# Patient Record
Sex: Female | Born: 1990 | Race: White | Hispanic: No | Marital: Single | Smoking: Never smoker
Health system: Southern US, Community
[De-identification: ages and names within clinical notes are randomized; demographics above are authoritative.]

## PROBLEM LIST (undated history)

## (undated) DIAGNOSIS — F419 Anxiety disorder, unspecified: Secondary | ICD-10-CM

## (undated) DIAGNOSIS — F32A Depression, unspecified: Secondary | ICD-10-CM

## (undated) DIAGNOSIS — T7840XA Allergy, unspecified, initial encounter: Secondary | ICD-10-CM

## (undated) DIAGNOSIS — D649 Anemia, unspecified: Secondary | ICD-10-CM

## (undated) DIAGNOSIS — E559 Vitamin D deficiency, unspecified: Secondary | ICD-10-CM

## (undated) HISTORY — DX: Depression, unspecified: F32.A

## (undated) HISTORY — DX: Anemia, unspecified: D64.9

## (undated) HISTORY — DX: Anxiety disorder, unspecified: F41.9

## (undated) HISTORY — PX: ADENOIDECTOMY: SUR15

## (undated) HISTORY — DX: Allergy, unspecified, initial encounter: T78.40XA

## (undated) HISTORY — PX: NO PAST SURGERIES: SHX2092

## (undated) HISTORY — DX: Vitamin D deficiency, unspecified: E55.9

---

## 2016-08-26 ENCOUNTER — Ambulatory Visit (INDEPENDENT_AMBULATORY_CARE_PROVIDER_SITE_OTHER): Payer: BLUE CROSS/BLUE SHIELD | Admitting: Urgent Care

## 2016-08-26 VITALS — BP 112/62 | HR 68 | Temp 98.1°F | Resp 16 | Ht 65.0 in | Wt 142.0 lb

## 2016-08-26 DIAGNOSIS — Z23 Encounter for immunization: Secondary | ICD-10-CM | POA: Diagnosis not present

## 2016-08-26 DIAGNOSIS — Z111 Encounter for screening for respiratory tuberculosis: Secondary | ICD-10-CM

## 2016-08-26 DIAGNOSIS — Z0000 Encounter for general adult medical examination without abnormal findings: Secondary | ICD-10-CM

## 2016-08-26 DIAGNOSIS — Z7189 Other specified counseling: Secondary | ICD-10-CM | POA: Diagnosis not present

## 2016-08-26 DIAGNOSIS — Z7185 Encounter for immunization safety counseling: Secondary | ICD-10-CM

## 2016-08-26 LAB — CBC
HCT: 38.1 % (ref 35.0–45.0)
HEMOGLOBIN: 12.7 g/dL (ref 11.7–15.5)
MCH: 30.1 pg (ref 27.0–33.0)
MCHC: 33.3 g/dL (ref 32.0–36.0)
MCV: 90.3 fL (ref 80.0–100.0)
MPV: 9.1 fL (ref 7.5–12.5)
Platelets: 265 10*3/uL (ref 140–400)
RBC: 4.22 MIL/uL (ref 3.80–5.10)
RDW: 12.2 % (ref 11.0–15.0)
WBC: 6.1 10*3/uL (ref 3.8–10.8)

## 2016-08-26 LAB — POCT URINALYSIS DIP (MANUAL ENTRY)
Bilirubin, UA: NEGATIVE
Glucose, UA: NEGATIVE
Ketones, POC UA: NEGATIVE
LEUKOCYTES UA: NEGATIVE
Nitrite, UA: NEGATIVE
PROTEIN UA: NEGATIVE
SPEC GRAV UA: 1.025
UROBILINOGEN UA: 0.2
pH, UA: 6

## 2016-08-26 LAB — HEPATITIS B SURFACE ANTIBODY, QUANTITATIVE: Hepatitis B-Post: 1000 m[IU]/mL

## 2016-08-26 NOTE — Progress Notes (Signed)

## 2016-08-26 NOTE — Progress Notes (Addendum)
Patient ID: NYIA TSAO, female   DOB: 14-Jul-1991, 25 y.o.   MRN: [id number]  By signing my name below I, Tereasa Coop, attest that this documentation has been prepared under the direction and in the presence of Jaynee Eagles, [place]. Electonically Signed. Tereasa Coop, Scribe 08/26/2016 at 9:24 AM   MRN: [id number]  Subjective:   Ms. Jill Stafford is a 25 y.o. female presenting for annual physical exam.  Medical care team includes: PCP: No primary care provider on file. Vision: wears glasses in class and at night while driving. Is followed by her eye doctor every year. Dental: Has cleanings every 6 months. OB/GYN: Normal Pap smear with her OB/Gyn June 2017. 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 Lives with friends. Has good support system. Drinks 1-2 alcoholic drinks per week. Denies history of smoking.    [name]  does not have a problem list on file.  [name]  Current Outpatient Prescriptions:    ethynodiol-ethinyl estradiol (KELNOR,ZOVIA) 1-35 MG-MCG tablet, Take 1 tablet by mouth daily., Disp: , Rfl:   She is allergic to fluconazole. Causes rash.   [name]  has a past medical history of Allergy; Anemia; and Anxiety. Denies past surgical history.  Denies any hospitalizations.  Paternal grandfather had lung cancer. Maternal Grandfather had prostate cancer.  Immunizations:  Needs Tdap and flu immunizations today. Had chicken pox as a child, did not have the vaccine. Needs chicken pox, hep B, and MMR titers. Needs TB testing. Denies history of positive TB test.    ROS    Objective:   Vitals: BP 112/62    Pulse 68    Temp 98.1 F (36.7 C) (Oral)    Resp 16    Ht '5\' 5"'$  (1.651 m)    Wt 142 lb (64.4 kg)    LMP 07/19/2016    SpO2 99%    BMI 23.63 kg/m   Physical Exam  Constitutional: She is oriented to person, place, and time. She appears well-developed and well-nourished.  HENT:  TM's intact bilaterally, no effusions or erythema. Nasal  turbinates pink and moist, nasal passages patent. No sinus tenderness. Oropharynx clear, mucous membranes moist, dentition in good repair.  Eyes: Conjunctivae and EOM are normal. Pupils are equal, round, and reactive to light. Right eye exhibits no discharge. Left eye exhibits no discharge. No scleral icterus.  Neck: Normal range of motion. Neck supple. No thyromegaly present.  Cardiovascular: Normal rate, regular rhythm and intact distal pulses.  Exam reveals no gallop and no friction rub.   No murmur heard. Pulmonary/Chest: No respiratory distress. She has no wheezes. She has no rales.  Abdominal: Soft. Bowel sounds are normal. She exhibits no distension and no mass. There is no tenderness.  Musculoskeletal: Normal range of motion. She exhibits no edema or tenderness.  Lymphadenopathy:    She has no cervical adenopathy.  Neurological: She is alert and oriented to person, place, and time. She has normal reflexes.  Skin: Skin is warm and dry. No rash noted. No erythema. No pallor.  Psychiatric: She has a normal mood and affect.   Results for orders placed or performed in visit on 08/26/16 (from the past 24 hour(s))  POCT urinalysis dipstick     Status: Abnormal   Collection Time: 08/26/16  9:15 AM  Result Value Ref Range   Color, UA yellow yellow   Clarity, UA clear clear   Glucose, UA negative negative   Bilirubin, UA negative negative   Ketones,  POC UA negative negative   Spec Grav, UA 1.025    Blood, UA small (A) negative   pH, UA 6.0    Protein Ur, POC negative negative   Urobilinogen, UA 0.2    Nitrite, UA Negative Negative   Leukocytes, UA Negative Negative    Assessment and Plan :   1. Annual physical exam - Medically healthy pleasant young woman. Discussed healthy lifestyle, diet, exercise, preventative care, vaccinations, and addressed patient's concerns.   2. Immunization counseling - Measles/Mumps/Rubella Immunity - Varicella zoster antibody, IgG - Hepatitis B  surface antibody  3. Need for prophylactic vaccination and inoculation against influenza - Flu Vaccine QUAD 36+ mos IM  4. Need for diphtheria-tetanus-pertussis (Tdap) vaccine - Tdap vaccine greater than or equal to 7yo IM  5. Screening for tuberculosis - TB Skin Test, 2 step PPD process reviewed with patient.  Jaynee Eagles, PA-C [facility] [phone] 08/26/2016  8:56 AM

## 2016-08-26 NOTE — Patient Instructions (Signed)

## 2016-08-28 ENCOUNTER — Ambulatory Visit (INDEPENDENT_AMBULATORY_CARE_PROVIDER_SITE_OTHER): Payer: BLUE CROSS/BLUE SHIELD | Admitting: Physician Assistant

## 2016-08-28 VITALS — BP 112/74 | HR 80 | Temp 98.2°F | Resp 17 | Ht 65.0 in | Wt 142.0 lb

## 2016-08-28 DIAGNOSIS — Z111 Encounter for screening for respiratory tuberculosis: Secondary | ICD-10-CM

## 2016-08-28 LAB — VARICELLA ZOSTER ANTIBODY, IGG: VARICELLA IGG: 1977 {index} — AB (ref <135.00)

## 2016-08-28 LAB — TB SKIN TEST
INDURATION: 0 mm
TB Skin Test: NEGATIVE

## 2016-08-28 LAB — MEASLES/MUMPS/RUBELLA IMMUNITY
Mumps IgG: 26.5 AU/mL — ABNORMAL HIGH (ref <9.00)
RUBELLA: 2.57 {index} — AB (ref <0.90)
Rubeola IgG: 82 AU/mL — ABNORMAL HIGH (ref <25.00)

## 2016-08-28 NOTE — Patient Instructions (Signed)
     IF you received an x-ray today, you will receive an invoice from [facility]. Please contact  Radiology at [phone] with questions or concerns regarding your invoice.   IF you received labwork today, you will receive an invoice from [facility]. Please contact [facility] at [phone] with questions or concerns regarding your invoice.   Our billing staff will not be able to assist you with questions regarding bills from these companies.  You will be contacted with the lab results as soon as they are available. The fastest way to get your results is to activate your My Chart account. Instructions are located on the last page of this paperwork. If you have not heard from us regarding the results in 2 weeks, please contact this office.      

## 2016-08-30 NOTE — Progress Notes (Signed)
Skin test reading

## 2016-09-14 ENCOUNTER — Ambulatory Visit (INDEPENDENT_AMBULATORY_CARE_PROVIDER_SITE_OTHER): Payer: BLUE CROSS/BLUE SHIELD | Admitting: Urgent Care

## 2016-09-14 VITALS — BP 110/72 | HR 83 | Temp 97.3°F | Resp 17 | Ht 65.0 in | Wt 134.0 lb

## 2016-09-14 DIAGNOSIS — Z111 Encounter for screening for respiratory tuberculosis: Secondary | ICD-10-CM | POA: Diagnosis not present

## 2016-09-14 NOTE — Patient Instructions (Addendum)
If the PPD tests do not meet their criteria, please ask if it would suffice for [place] to perform a quantiferon blood test to screen for tuberculosis.    IF you received an x-ray today, you will receive an invoice from [facility]. Please contact [facility] at [phone] with questions or concerns regarding your invoice.   IF you received labwork today, you will receive an invoice from [facility]. Please contact [facility] at [phone] with questions or concerns regarding your invoice.   Our billing staff will not be able to assist you with questions regarding bills from these companies.  You will be contacted with the lab results as soon as they are available. The fastest way to get your results is to activate your My Chart account. Instructions are located on the last page of this paperwork. If you have not heard from [place] regarding the results in 2 weeks, please contact this office.

## 2016-09-14 NOTE — Progress Notes (Signed)
    MRN: [id number] DOB: [birth year]  Subjective:   Jill Stafford is a 25 y.o. 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 She has completed all required testing. She has immunity to MMR, varicella, Hep B. Her form has been completed. She needs 2nd step PPD today.  Brenae has a current medication list which includes the following prescription(s): ethynodiol-ethinyl estradiol. Also is allergic to fluconazole.  Branden  has a past medical history of Allergy; Anemia; and Anxiety. Also  has no past surgical history on file.   Objective:   Vitals: BP 110/72 (BP Location: Right Arm, Patient Position: Sitting, Cuff Size: Normal)   Pulse 83   Temp 97.3 F (36.3 C) (Oral)   Resp 17   Ht _0  (1.651 m)   Wt 134 lb (60.8 kg)   LMP 08/15/2016 (Approximate)   SpO2 100%   BMI 22.30 kg/m   Physical Exam  Constitutional: She is oriented to person, place, and time. She appears well-developed and well-nourished.  Cardiovascular: Normal rate.   Pulmonary/Chest: Effort normal.  Neurological: She is alert and oriented to person, place, and time.   Assessment and Plan :   1. Screening for tuberculosis - PPD placed today. Patient to rtc on 09/16/2016. If her school does not accept the 2nd PPD for the time frame they require, we will pursue a Quantiferon test. I reviewed her titers in clinic for MMR, varicella, Hep B.    Jaynee Eagles, PA-C [facility] [phone] 09/14/2016 2:47 PM

## 2017-01-09 ENCOUNTER — Ambulatory Visit (INDEPENDENT_AMBULATORY_CARE_PROVIDER_SITE_OTHER): Payer: BLUE CROSS/BLUE SHIELD | Admitting: Physician Assistant

## 2017-01-09 VITALS — BP 112/60 | HR 84 | Temp 97.8°F | Resp 18 | Ht 65.0 in | Wt 132.0 lb

## 2017-01-09 DIAGNOSIS — Z111 Encounter for screening for respiratory tuberculosis: Secondary | ICD-10-CM

## 2017-01-09 NOTE — Patient Instructions (Signed)
     IF you received an x-ray today, you will receive an invoice from [facility]. Please contact [facility] at [phone] with questions or concerns regarding your invoice.   IF you received labwork today, you will receive an invoice from [facility]. Please contact [facility] at [phone] with questions or concerns regarding your invoice.   Our billing staff will not be able to assist you with questions regarding bills from these companies.  You will be contacted with the lab results as soon as they are available. The fastest way to get your results is to activate your My Chart account. Instructions are located on the last page of this paperwork. If you have not heard from us regarding the results in 2 weeks, please contact this office.     

## 2017-01-09 NOTE — Progress Notes (Signed)
  01/09/2017 3:26 PM   DOB: 13-Mar-1991 / MRN: [id number]  SUBJECTIVE:  Jill Stafford is a 26 y.o. female presenting for TB rule out.  Denies cough, night sweats, chronic fever, SOB, sudden unexplained weight loss.  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   Immunization History  Administered Date(s) Administered  . Influenza,inj,Quad PF,36+ Mos 08/26/2016  . PPD Test 08/26/2016, 09/14/2016  . Tdap 08/26/2016    She is allergic to fluconazole.   She  has a past medical history of Allergy; Anemia; and Anxiety.    She  reports that she has never smoked. She has never used smokeless tobacco. She reports that she does not drink alcohol or use drugs. She  has no sexual activity history on file. The patient  has no past surgical history on file.  Her family history is not on file.  ROS  Per HPI  The problem list and medications were reviewed and updated by myself where necessary and exist elsewhere in the encounter.   OBJECTIVE:  BP 112/60   Pulse 84   Temp 97.8 F (36.6 C) (Oral)   Resp 18   Ht 5\' 5"  (1.651 m)   Wt 132 lb (59.9 kg)   SpO2 99%   BMI 21.97 kg/m   Physical Exam  Constitutional: She appears well-nourished.  Cardiovascular: Normal rate.   Pulmonary/Chest: Effort normal.  Vitals reviewed.   No results found for this or any previous visit (from the past 72 hour(s)).  No results found.  ASSESSMENT AND PLAN:  Jill Stafford was seen today for labs only.  Diagnoses and all orders for this visit:  Screening-pulmonary TB -     Quantiferon tb gold assay    The patient is advised to call or return to clinic if she does not see an improvement in symptoms, or to seek the care of the closest emergency department if she worsens with the above plan.   Philis Fendt, MHS, PA-C [facility] 01/09/2017 3:26 PM

## 2017-01-12 LAB — QUANTIFERON TB GOLD ASSAY (BLOOD)

## 2017-01-12 LAB — QUANTIFERON IN TUBE
QFT TB AG MINUS NIL VALUE: 0.05 IU/mL
QUANTIFERON MITOGEN VALUE: 6.59 [IU]/mL
QUANTIFERON NIL VALUE: 0.02 [IU]/mL
QUANTIFERON TB AG VALUE: 0.07 [IU]/mL
QUANTIFERON TB GOLD: NEGATIVE

## 2018-05-22 ENCOUNTER — Ambulatory Visit: Payer: Self-pay | Admitting: Family Medicine

## 2018-05-22 VITALS — BP 95/70 | HR 66 | Temp 98.4°F | Resp 16 | Wt 152.2 lb

## 2018-05-22 DIAGNOSIS — N390 Urinary tract infection, site not specified: Secondary | ICD-10-CM

## 2018-05-22 LAB — POCT URINALYSIS DIPSTICK
BILIRUBIN UA: NEGATIVE
Glucose, UA: NEGATIVE
Ketones, UA: NEGATIVE
Nitrite, UA: POSITIVE
PH UA: 7.5 (ref 5.0–8.0)
Protein, UA: POSITIVE — AB
RBC UA: NEGATIVE
SPEC GRAV UA: 1.01 (ref 1.010–1.025)
UROBILINOGEN UA: 1 U/dL

## 2018-05-22 MED ORDER — PHENAZOPYRIDINE HCL 200 MG PO TABS: 200.0000 mg | ORAL_TABLET | Freq: Three times a day (TID) | ORAL | 0 refills | Status: DC | PRN

## 2018-05-22 MED ORDER — NITROFURANTOIN MONOHYD MACRO 100 MG PO CAPS: 100.0000 mg | ORAL_CAPSULE | Freq: Two times a day (BID) | ORAL | 0 refills | Status: AC

## 2018-05-22 NOTE — Patient Instructions (Signed)

## 2018-05-22 NOTE — Progress Notes (Signed)
Jill Stafford is a 27 y.o. female who presents today with concerns of painful urination for the last 2 weeks without treatment. She denies recent similar infection with treatment in the last 30 days.  Review of Systems  Constitutional: Negative for chills, fever and malaise/fatigue.  HENT: Negative for congestion, ear discharge, ear pain, sinus pain and sore throat.   Eyes: Negative.   Respiratory: Negative for cough, sputum production and shortness of breath.   Cardiovascular: Negative.  Negative for chest pain.  Gastrointestinal: Negative for abdominal pain, diarrhea, nausea and vomiting.  Genitourinary: Positive for dysuria, frequency and urgency. Negative for hematuria.  Musculoskeletal: Negative for myalgias.  Skin: Negative.   Neurological: Negative for headaches.  Endo/Heme/Allergies: Negative.   Psychiatric/Behavioral: Negative.     O: Vitals:   05/22/18 1227  BP: 95/70  Pulse: 66  Resp: 16  Temp: 98.4 F (36.9 C)  SpO2: 96%     Physical Exam  Constitutional: She is oriented to person, place, and time. Vital signs are normal. She appears well-developed and well-nourished. She is active.  Non-toxic appearance. She does not have a sickly appearance.  HENT:  Head: Normocephalic.  Right Ear: Hearing, tympanic membrane, external ear and ear canal normal.  Left Ear: Hearing, tympanic membrane, external ear and ear canal normal.  Nose: Nose normal.  Mouth/Throat: Uvula is midline and oropharynx is clear and moist.  Neck: Normal range of motion. Neck supple.  Cardiovascular: Normal rate, regular rhythm, normal heart sounds and normal pulses.  Pulmonary/Chest: Effort normal and breath sounds normal.  Abdominal: Soft. Bowel sounds are normal. There is tenderness in the suprapubic area. There is no CVA tenderness.  Musculoskeletal: Normal range of motion.  Lymphadenopathy:       Head (right side): No submental and no submandibular adenopathy present.       Head (left side):  No submental and no submandibular adenopathy present.    She has no cervical adenopathy.  Neurological: She is alert and oriented to person, place, and time.  Psychiatric: She has a normal mood and affect.  Vitals reviewed.    A: 1. Urinary tract infection without hematuria, site unspecified      P: Exam findings, diagnosis etiology and medication use and indications reviewed with patient. Follow- Up and discharge instructions provided. No emergent/urgent issues found on exam.  Patient verbalized understanding of information provided and agrees with plan of care (POC), all questions answered.  1. Urinary tract infection without hematuria, site unspecified - POCT urinalysis dipstick - nitrofurantoin, macrocrystal-monohydrate, (MACROBID) 100 MG capsule; Take 1 capsule (100 mg total) by mouth 2 (two) times daily for 10 days. - phenazopyridine (PYRIDIUM) 200 MG tablet; Take 1 tablet (200 mg total) by mouth 3 (three) times daily as needed for pain. With food- may discolor urine  Results for orders placed or performed in visit on 05/22/18 (from the past 24 hour(s))  POCT urinalysis dipstick     Status: Abnormal   Collection Time: 05/22/18 12:36 PM  Result Value Ref Range   Color, UA YELLOW    Clarity, UA CLOUDY    Glucose, UA Negative Negative   Bilirubin, UA NEGATIVE    Ketones, UA NEGATIVE    Spec Grav, UA 1.010 1.010 - 1.025   Blood, UA NEGATIVE    pH, UA 7.5 5.0 - 8.0   Protein, UA Positive (A) Negative   Urobilinogen, UA 1.0 0.2 or 1.0 E.U./dL   Nitrite, UA POSITIVE    Leukocytes, UA Large (3+) (A) Negative  Appearance     Odor

## 2018-05-24 ENCOUNTER — Telehealth: Payer: Self-pay | Admitting: Emergency Medicine

## 2018-05-24 NOTE — Telephone Encounter (Signed)
Called patient to follow up with them since their visit, no response let vm

## 2018-06-10 ENCOUNTER — Ambulatory Visit: Payer: Self-pay | Admitting: Family Medicine

## 2018-06-10 VITALS — BP 110/62 | HR 94 | Temp 98.3°F | Resp 16 | Wt 155.0 lb

## 2018-06-10 DIAGNOSIS — N390 Urinary tract infection, site not specified: Secondary | ICD-10-CM

## 2018-06-10 DIAGNOSIS — R300 Dysuria: Secondary | ICD-10-CM

## 2018-06-10 LAB — POCT URINALYSIS DIPSTICK
Bilirubin, UA: NEGATIVE
Blood, UA: NEGATIVE
Glucose, UA: NEGATIVE
KETONES UA: 10
NITRITE UA: NEGATIVE
PH UA: 5.5 (ref 5.0–8.0)
Protein, UA: POSITIVE — AB
Spec Grav, UA: 1.01 (ref 1.010–1.025)
UROBILINOGEN UA: 0.2 U/dL

## 2018-06-10 MED ORDER — CIPROFLOXACIN HCL 250 MG PO TABS: 250.0000 mg | ORAL_TABLET | Freq: Two times a day (BID) | ORAL | 0 refills | Status: DC

## 2018-06-10 NOTE — Patient Instructions (Signed)
Pyelonephritis, Adult Pyelonephritis is a kidney infection. The kidneys are organs that help clean your blood by moving waste out of your blood and into your pee (urine). This infection can happen quickly, or it can last for a long time. In most cases, it clears up with treatment and does not cause other problems. Follow these instructions at home: Medicines  Take over-the-counter and prescription medicines only as told by your doctor.  Take your antibiotic medicine as told by your doctor. Do not stop taking the medicine even if you start to feel better. General instructions  Drink enough fluid to keep your pee clear or pale yellow.  Avoid caffeine, tea, and carbonated drinks.  Pee (urinate) often. Avoid holding in pee for long periods of time.  Pee before and after sex.  After pooping (having a bowel movement), women should wipe from front to back. Use each tissue only once.  Keep all follow-up visits as told by your doctor. This is important. Contact a doctor if:  You do not feel better after 2 days.  Your symptoms get worse.  You have a fever. Get help right away if:  You cannot take your medicine or drink fluids as told.  You have chills and shaking.  You throw up (vomit).  You have very bad pain in your side (flank) or back.  You feel very weak or you pass out (faint). This information is not intended to replace advice given to you by your health care provider. Make sure you discuss any questions you have with your health care provider. Document Released: 11/23/2004 Document Revised: 03/23/2016 Document Reviewed: 02/08/2015 Elsevier Interactive Patient Education  2018 Elsevier Inc.  

## 2018-06-10 NOTE — Progress Notes (Signed)
Jill Stafford is a 27 y.o. female who presents today with concerns of urinary tract infection. She was recently treated for this condition in the last 30 days. She reported improvement then return of symptoms.  Review of Systems  Constitutional: Negative for chills, fever and malaise/fatigue.  HENT: Negative for congestion, ear discharge, ear pain, sinus pain and sore throat.   Eyes: Negative.   Respiratory: Negative for cough, sputum production and shortness of breath.   Cardiovascular: Negative.  Negative for chest pain.  Gastrointestinal: Negative for abdominal pain, diarrhea, nausea and vomiting.  Genitourinary: Positive for dysuria, frequency and urgency. Negative for hematuria.  Musculoskeletal: Positive for back pain. Negative for myalgias.  Skin: Negative.   Neurological: Negative for headaches.  Endo/Heme/Allergies: Negative.   Psychiatric/Behavioral: Negative.     O: Vitals:   06/10/18 1431  BP: 110/62  Pulse: 94  Resp: 16  Temp: 98.3 F (36.8 C)  SpO2: 97%     Physical Exam  Constitutional: She is oriented to person, place, and time. Vital signs are normal. She appears well-developed and well-nourished. She is active.  Non-toxic appearance. She does not have a sickly appearance.  HENT:  Head: Normocephalic.  Right Ear: Hearing, tympanic membrane, external ear and ear canal normal.  Left Ear: Hearing, tympanic membrane, external ear and ear canal normal.  Nose: Nose normal.  Mouth/Throat: Uvula is midline and oropharynx is clear and moist.  Neck: Normal range of motion. Neck supple.  Cardiovascular: Normal rate, regular rhythm, normal heart sounds and normal pulses.  Pulmonary/Chest: Effort normal and breath sounds normal.  Abdominal: Soft. Bowel sounds are normal. There is no tenderness. There is CVA tenderness. There is no rigidity, no rebound, no guarding, no tenderness at McBurney's point and negative Murphy's sign.  Lower abdominal pelvic discomfort with  bilateral flank pain when tapped  Musculoskeletal: Normal range of motion.  Lymphadenopathy:       Head (right side): No submental and no submandibular adenopathy present.       Head (left side): No submental and no submandibular adenopathy present.    She has no cervical adenopathy.  Neurological: She is alert and oriented to person, place, and time.  Psychiatric: She has a normal mood and affect.  Vitals reviewed.  A: 1. Urinary tract infection without hematuria, site unspecified   2. Dysuria     P: Discussed exam findings, diagnosis etiology and medication use and indications reviewed with patient. Follow- Up and discharge instructions provided. No emergent/urgent issues found on exam.  Patient verbalized understanding of information provided and agrees with plan of care (POC), all questions answered.  1. Urinary tract infection without hematuria, site unspecified Advised to use medication and f/u in 36-48 hours via phone or in person with report on improvement progress-advised to seek care with PCP for comprehensive evaluation if symptoms persist.    Meds ordered this encounter  Medications  . ciprofloxacin (CIPRO) 250 MG tablet    Sig: Take 1 tablet (250 mg total) by mouth 2 (two) times daily.    Dispense:  6 tablet    Refill:  0    Order Specific Question:   Supervising Provider    Answer:   Benay Pillow E [[id number]]    2. Dysuria - POCT urinalysis dipstick - ciprofloxacin (CIPRO) 250 MG tablet; Take 1 tablet (250 mg total) by mouth 2 (two) times daily.  Results for orders placed or performed in visit on 06/10/18 (from the past 24 hour(s))  POCT urinalysis dipstick  Status: Abnormal   Collection Time: 06/10/18  2:45 PM  Result Value Ref Range   Color, UA YELLOW    Clarity, UA CLOUDY    Glucose, UA Negative Negative   Bilirubin, UA NEGATIVE    Ketones, UA 10    Spec Grav, UA 1.010 1.010 - 1.025   Blood, UA negative    pH, UA 5.5 5.0 - 8.0   Protein, UA Positive  (A) Negative   Urobilinogen, UA 0.2 0.2 or 1.0 E.U./dL   Nitrite, UA NEGATIVE    Leukocytes, UA Large (3+) (A) Negative   Appearance     Odor

## 2018-06-12 ENCOUNTER — Telehealth: Payer: Self-pay

## 2018-06-12 NOTE — Telephone Encounter (Signed)
I left a message asking the patient asking to call us back if she has any questions or concerns.

## 2020-07-07 ENCOUNTER — Other Ambulatory Visit: Payer: Self-pay

## 2020-07-07 ENCOUNTER — Ambulatory Visit: Payer: 59 | Admitting: Physician Assistant

## 2020-07-07 ENCOUNTER — Encounter: Payer: Self-pay | Admitting: Physician Assistant

## 2020-07-07 VITALS — BP 100/70 | HR 74 | Temp 97.8°F | Resp 16 | Ht 65.0 in | Wt 161.0 lb

## 2020-07-07 DIAGNOSIS — N946 Dysmenorrhea, unspecified: Secondary | ICD-10-CM

## 2020-07-07 LAB — POCT URINE PREGNANCY: Preg Test, Ur: NEGATIVE

## 2020-07-07 MED ORDER — ETHYNODIOL DIAC-ETH ESTRADIOL 1-35 MG-MCG PO TABS: 1.0000 | ORAL_TABLET | Freq: Every day | ORAL | 11 refills | Status: DC

## 2020-07-07 NOTE — Patient Instructions (Signed)
Please restart the OCP as directed. Make sure to continue consistent use of condoms for STI prevention.  You will be contacted to schedule a pelvic ultrasound so we can assess for cysts/fibroids, etc.   Try to apply some barrier cream to the area of concern to protect it from friction and let things calm down. This seems to be the start of a benign skin tag. Let me know if still causing issue.

## 2020-07-07 NOTE — Progress Notes (Signed)
Patient presents to clinic today to establish care.  Patient with history of irregular periods and dysmenorrhea. Was previously on OCPs but has not had in a couple of years. Is noting recurrence of significant dysmenorrhea over the past several months culminating in significant cramping and low back pain. Noted significant increase in the amount of bleeding. There is some family history of fibroids but she has never been diagnosed with this. Will take Ibuprofen but was not as helpful as usual. Pain starts 3-4 days before menstrual pain and usually dissipates 3 days into cycle. Periods typically last 5-6 days. Does have history of iron deficiency.  LMP ended on 06/15/2020. Is sexually active with one female partner with consistent use of condoms.   Health Maintenance: Immunizations -- records requested. PAP -- Overdue per patient.   Past Medical History:  Diagnosis Date   Allergy    Anemia    Anxiety     No past surgical history on file.  No current outpatient medications on file prior to visit.   No current facility-administered medications on file prior to visit.    Allergies  Allergen Reactions   Fluconazole Rash    No family history on file.  Social History   Socioeconomic History   Marital status: Single    Spouse name: Not on file   Number of children: Not on file   Years of education: Not on file   Highest education level: Not on file  Occupational History   Not on file  Tobacco Use   Smoking status: Never Smoker   Smokeless tobacco: Never Used  Substance and Sexual Activity   Alcohol use: No   Drug use: No   Sexual activity: Not on file  Other Topics Concern   Not on file  Social History Narrative   Not on file   Social Determinants of Health   Financial Resource Strain:    Difficulty of Paying Living Expenses: Not on file  Food Insecurity:    Worried About [place] in the Last Year: Not on file   Ran Out of Food in the  Last Year: Not on file  Transportation Needs:    Lack of Transportation (Medical): Not on file   Lack of Transportation (Non-Medical): Not on file  Physical Activity:    Days of Exercise per Week: Not on file   Minutes of Exercise per Session: Not on file  Stress:    Feeling of Stress : Not on file  Social Connections:    Frequency of Communication with Friends and Family: Not on file   Frequency of Social Gatherings with Friends and Family: Not on file   Attends Religious Services: Not on file   Active Member of Clubs or Organizations: Not on file   Attends Archivist Meetings: Not on file   Marital Status: Not on file  Intimate Partner Violence:    Fear of Current or Ex-Partner: Not on file   Emotionally Abused: Not on file   Physically Abused: Not on file   Sexually Abused: Not on file   ROS Pertinent ROS are listed in the HPI.   Resp 16    Ht 5\' 5"  (1.651 m)    Wt 161 lb (73 kg)    BMI 26.79 kg/m   Physical Exam Vitals reviewed.  Constitutional:      Appearance: Normal appearance.  HENT:     Head: Normocephalic and atraumatic.  Eyes:     Conjunctiva/sclera: Conjunctivae normal.  Pupils: Pupils are equal, round, and reactive to light.  Cardiovascular:     Rate and Rhythm: Normal rate and regular rhythm.     Pulses: Normal pulses.     Heart sounds: Normal heart sounds.  Pulmonary:     Effort: Pulmonary effort is normal.     Breath sounds: Normal breath sounds.  Abdominal:     General: Bowel sounds are normal.     Palpations: Abdomen is soft.  Musculoskeletal:     Cervical back: Neck supple.  Neurological:     Mental Status: She is alert.  Psychiatric:        Mood and Affect: Mood normal.    Assessment/Plan: 1. Dysmenorrhea Urine preg negative. Will restart prior OCP to start to help with symptoms. Will also proceed with pelvic/transvaginal US to assess ovaries and uterus to ensure no cysts/fibroids contributing to her symptoms.   - POCT urine pregnancy - US Pelvic Complete With Transvaginal; Future  This visit occurred during the SARS-CoV-2 public health emergency.  Safety protocols were in place, including screening questions prior to the visit, additional usage of staff PPE, and extensive cleaning of exam room while observing appropriate contact time as indicated for disinfecting solutions.    Leeanne Rio, PA-C

## 2020-07-17 ENCOUNTER — Encounter: Payer: Self-pay | Admitting: Physician Assistant

## 2020-07-19 ENCOUNTER — Ambulatory Visit
Admission: RE | Admit: 2020-07-19 | Discharge: 2020-07-19 | Disposition: A | Discharge status: Home / Self Care | Payer: 59 | Source: Ambulatory Visit | Attending: Family Medicine | Admitting: Family Medicine

## 2020-07-19 ENCOUNTER — Other Ambulatory Visit: Payer: Self-pay

## 2020-07-19 VITALS — HR 88 | Temp 98.1°F | Resp 16

## 2020-07-19 DIAGNOSIS — N946 Dysmenorrhea, unspecified: Secondary | ICD-10-CM

## 2020-07-19 MED ORDER — MEFENAMIC ACID 250 MG PO CAPS: 250.0000 mg | ORAL_CAPSULE | Freq: Four times a day (QID) | ORAL | 0 refills | Status: DC | PRN

## 2020-07-19 NOTE — ED Triage Notes (Signed)
Patient states that when pain occurs it is severe and today patient felt that her hands became numb with increase in pelvic pan today. Denies any fever

## 2020-07-19 NOTE — ED Provider Notes (Signed)
[facility]    CSN: [id number] Arrival date & time: 07/19/20  1201      History   Chief Complaint Chief Complaint  Patient presents with  . Pelvic Pain    HPI Jill Stafford is a 29 y.o. female.   Complains of pelvic pain with menstrual period.  Had formally been on birth control pills since age 43 but those were discontinued and her pain has recurred.  HPI  Past Medical History:  Diagnosis Date  . Allergy   . Anemia   . Anxiety     There are no problems to display for this patient.   Past Surgical History:  Procedure Laterality Date  . NO PAST SURGERIES      OB History   No obstetric history on file.      Home Medications    Prior to Admission medications   Medication Sig Start Date End Date Taking? Authorizing Provider  ethynodiol-ethinyl estradiol (ZOVIA) 1-35 MG-MCG tablet Take 1 tablet by mouth daily. 07/07/20  Yes Brunetta Jeans, PA-C  Mefenamic Acid 250 MG CAPS Take 1 capsule (250 mg total) by mouth 4 (four) times daily as needed. 07/19/20   Wardell Honour, MD    Family History Family History  Problem Relation Age of Onset  . Rheum arthritis Father   . Cancer Maternal Grandfather        Lung  . Cancer Paternal Grandfather        Prostate    Social History Social History   Tobacco Use  . Smoking status: Never Smoker  . Smokeless tobacco: Never Used  Vaping Use  . Vaping Use: Never used  Substance Use Topics  . Alcohol use: No  . Drug use: No     Allergies   Fluconazole   Review of Systems Review of Systems  Genitourinary: Positive for menstrual problem and pelvic pain.  All other systems reviewed and are negative.    Physical Exam Triage Vital Signs ED Triage Vitals  Enc Vitals Group     BP --      Pulse Rate 07/19/20 1217 88     Resp 07/19/20 1217 16     Temp 07/19/20 1217 98.1 F (36.7 C)     Temp Source 07/19/20 1217 Oral     SpO2 07/19/20 1217 96 %     Weight --      Height --      Head  Circumference --      Peak Flow --      Pain Score 07/19/20 1222 6     Pain Loc --      Pain Edu? --      Excl. in [place]? --    No data found.  Updated Vital Signs Pulse 88   Temp 98.1 F (36.7 C) (Oral)   Resp 16   LMP 07/19/2020 (LMP Unknown)   SpO2 96%   Visual Acuity Right Eye Distance:   Left Eye Distance:   Bilateral Distance:    Right Eye Near:   Left Eye Near:    Bilateral Near:     Physical Exam Vitals and nursing note reviewed.  Constitutional:      Appearance: Normal appearance.  HENT:     Head: Normocephalic.  Abdominal:     General: Abdomen is flat. Bowel sounds are normal.     Palpations: Abdomen is soft.  Neurological:     Mental Status: She is alert and oriented to person, place, and time.  UC Treatments / Results  Labs (all labs ordered are listed, but only abnormal results are displayed) Labs Reviewed - No data to display  EKG   Radiology No results found.  Procedures Procedures (including critical care time)  Medications Ordered in UC Medications - No data to display  Initial Impression / Assessment and Plan / UC Course  I have reviewed the triage vital signs and the nursing notes.  Pertinent labs & imaging results that were available during my care of the patient were reviewed by me and considered in my medical decision making (see chart for details).     Dysmenorrhea Final Clinical Impressions(s) / UC Diagnoses   Final diagnoses:  None   Discharge Instructions   None    ED Prescriptions    Medication Sig Dispense Auth. Provider   Mefenamic Acid 250 MG CAPS Take 1 capsule (250 mg total) by mouth 4 (four) times daily as needed. 28 capsule Wardell Honour, MD     PDMP not reviewed this encounter.   Wardell Honour, MD 07/19/20 [phone]

## 2020-07-19 NOTE — ED Triage Notes (Signed)
Patient coming in with complaints of pelvic pain since May. Patient is currently on period, complains of increased back pain. Saw primary care doctor and has ultrasound scheduled on 07/30/20. Patient states she took ibuprofen 3- 200 mg tabs today and did have some relief.  One episode of emesis today.

## 2020-07-24 ENCOUNTER — Other Ambulatory Visit: Payer: Self-pay | Admitting: Family Medicine

## 2020-07-30 ENCOUNTER — Other Ambulatory Visit: Payer: Self-pay | Admitting: Physician Assistant

## 2020-07-30 ENCOUNTER — Ambulatory Visit
Admission: RE | Admit: 2020-07-30 | Discharge: 2020-07-30 | Disposition: A | Discharge status: Home / Self Care | Payer: 59 | Source: Ambulatory Visit | Attending: Physician Assistant | Admitting: Physician Assistant

## 2020-07-30 ENCOUNTER — Telehealth: Payer: Self-pay

## 2020-07-30 DIAGNOSIS — N946 Dysmenorrhea, unspecified: Secondary | ICD-10-CM

## 2020-07-30 DIAGNOSIS — N838 Other noninflammatory disorders of ovary, fallopian tube and broad ligament: Secondary | ICD-10-CM

## 2020-07-30 IMAGING — US US PELVIS COMPLETE WITH TRANSVAGINAL
1 series · 13 of 25 positions shown · non-contrast
Comparison: None

CLINICAL DATA: Patient with dysmenorrhea.

EXAM:
TRANSABDOMINAL AND TRANSVAGINAL ULTRASOUND OF PELVIS
TECHNIQUE: Both transabdominal and transvaginal ultrasound examinations of the
pelvis were performed. Transabdominal technique was performed for
global imaging of the pelvis including uterus, ovaries, adnexal
regions, and pelvic cul-de-sac. It was necessary to proceed with
endovaginal exam following the transabdominal exam to visualize the
adnexal structures.

[Series 1: us pelvis complete with transvaginal · 0.20mm/px · 13 of 95 slices shown]
[im 1/95]
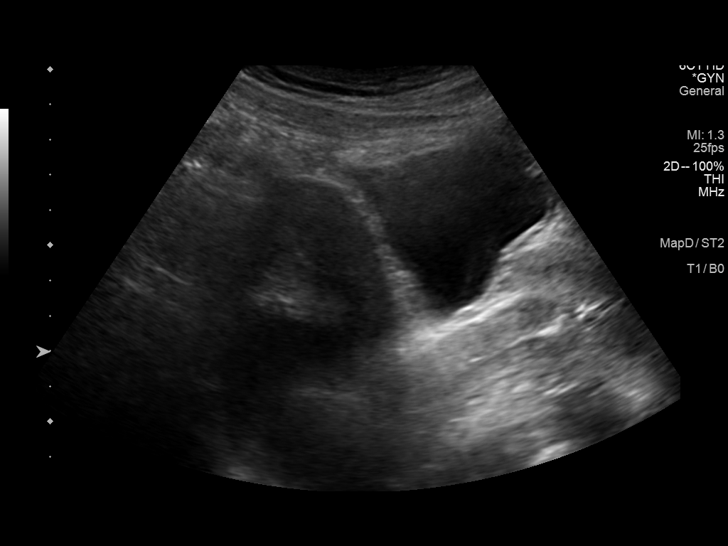
[im 8/95]
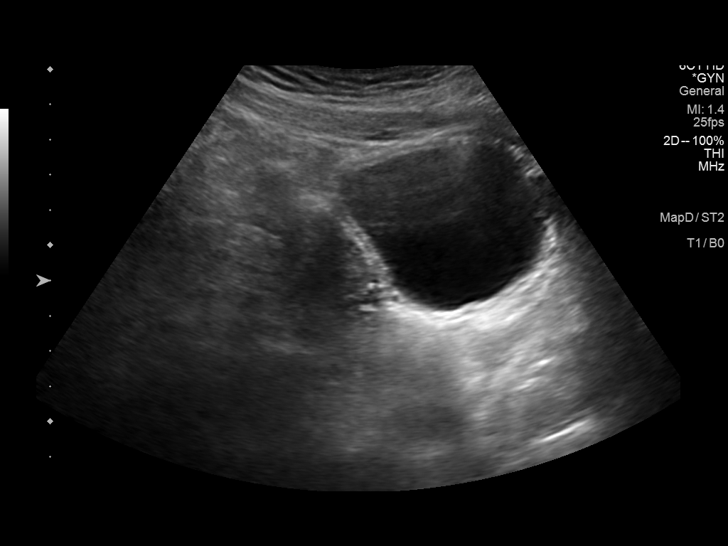
[im 16/95]
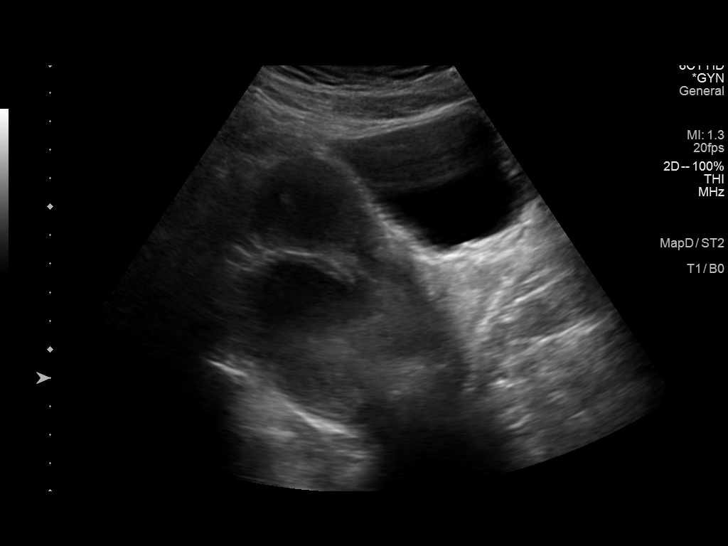
[im 24/95]
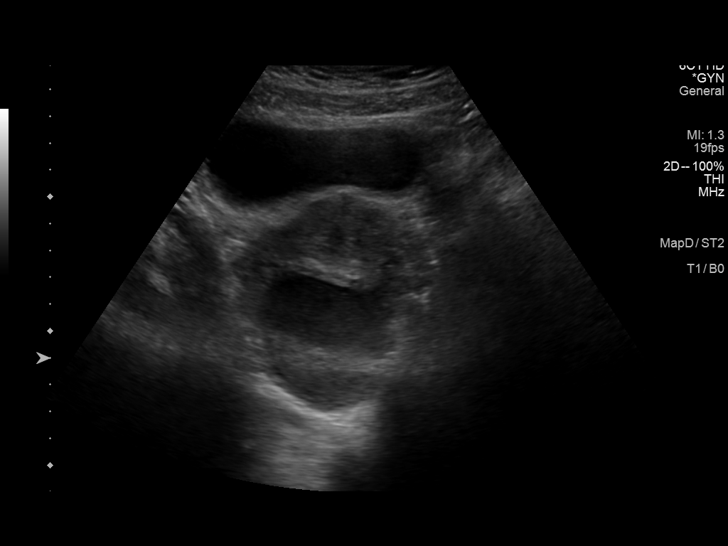
[im 32/95]
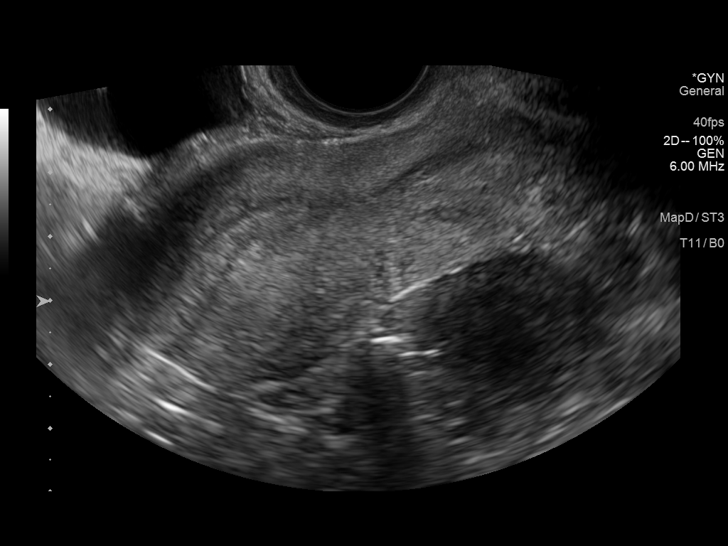
[im 40/95]
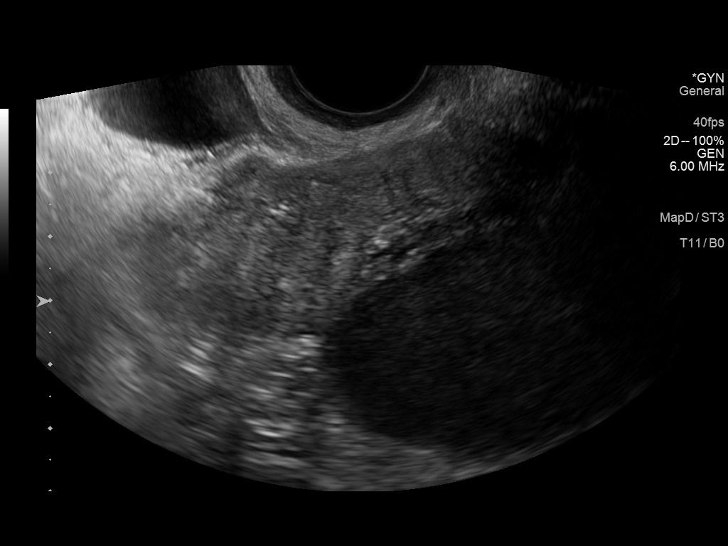
[im 48/95]
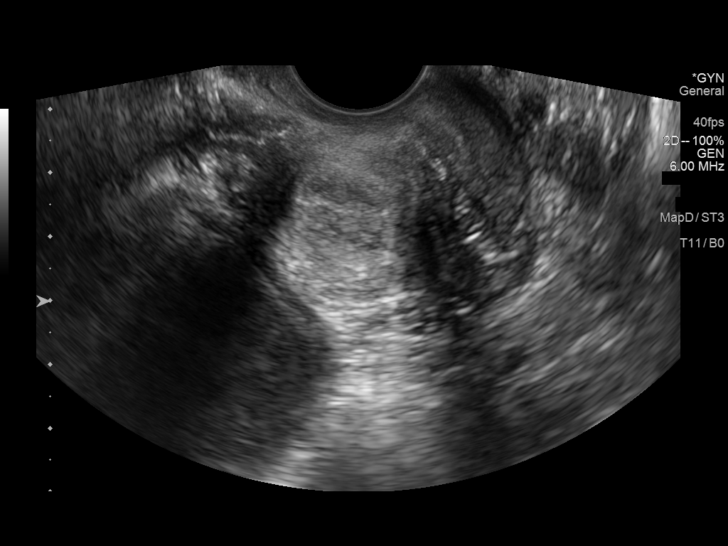
[im 55/95]
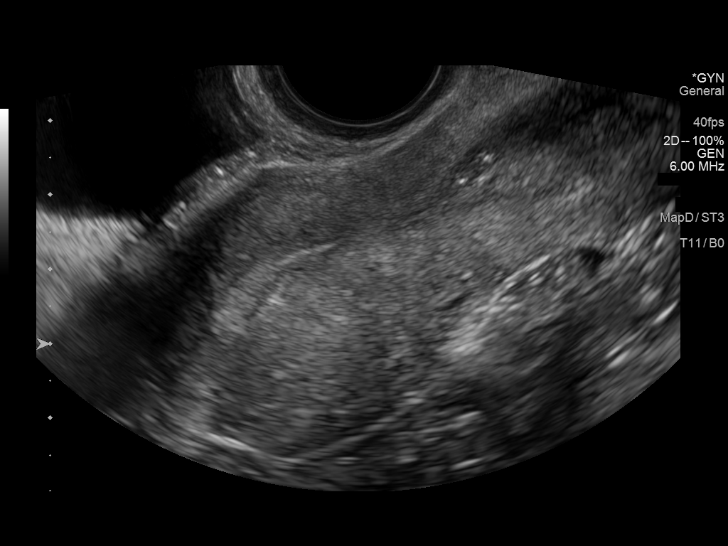
[im 63/95]
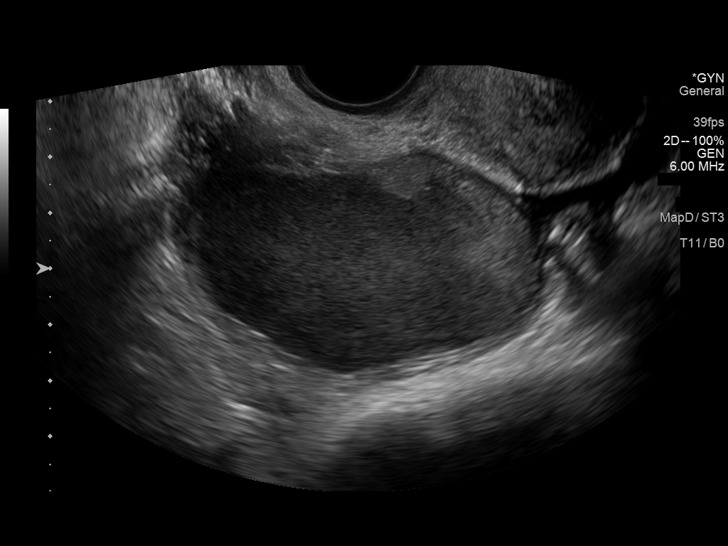
[im 71/95]
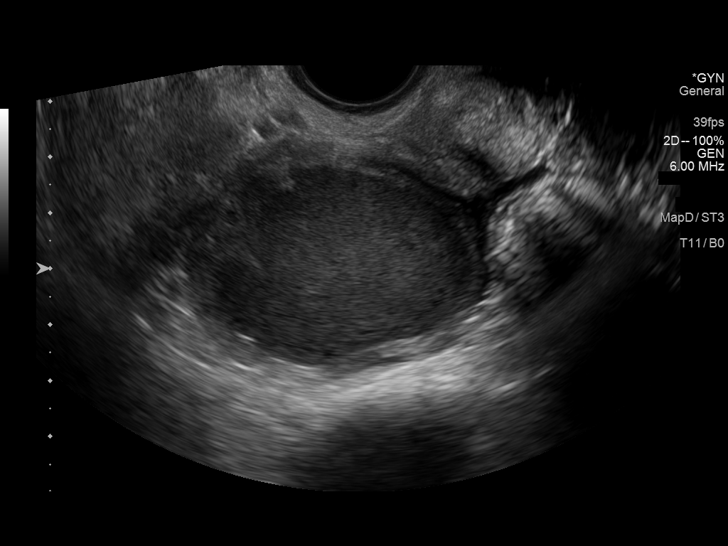
[im 79/95]
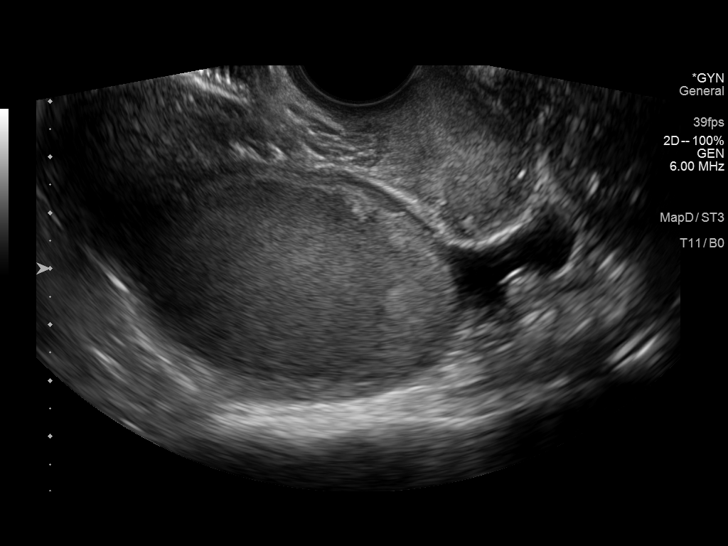
[im 87/95]
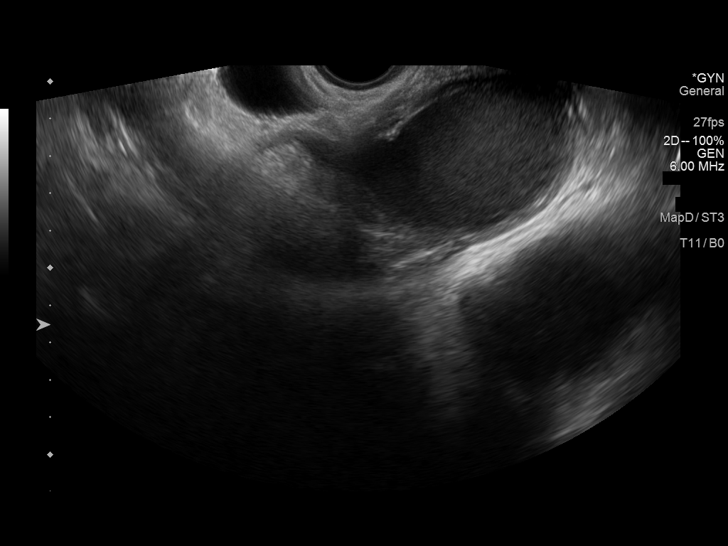
[im 95/95]
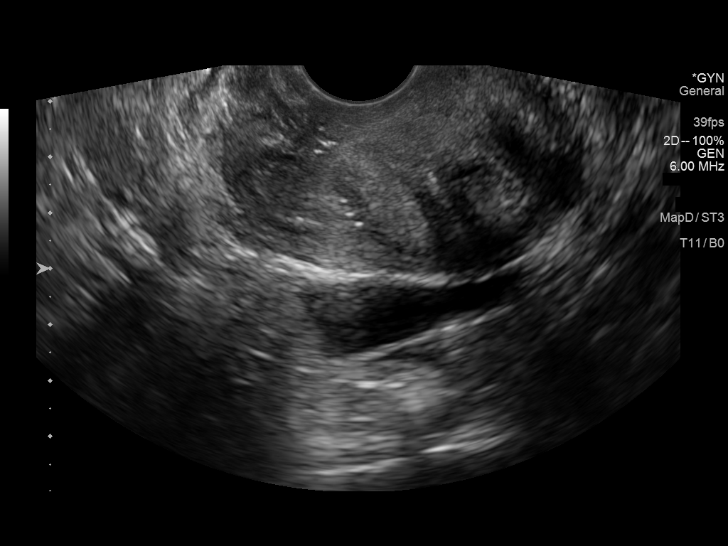

[13 of 25 positions shown; findings below may reference images not displayed]

FINDINGS: Uterus

Measurements: 9.4 x 4.04.5 cm = volume: 80.9 mL. No fibroids or
other mass visualized.

Endometrium

Thickness: 4 mm.  No focal abnormality visualized.

Right ovary

Measurements: 6.6 x 4.1 x 5.2 cm = volume: 73.9 mL. Within the right
ovary there is a 6.1 x 3.5 x 4.9 cm complex cystic structure with
internal echogenicity. There is mild nodularity the peripheral
aspect of the cystic wall.

Left ovary

Measurements: 3.5 x 2.2 x 2.0 cm = volume: 8.1 mL. Normal
appearance/no adnexal mass.

Other findings

Small amount of fluid in the pelvis.
IMPRESSION: There is a complex cystic structure within the right ovary measuring
up to 6.1 cm with internal echogenicity and mild nodularity of the
peripheral aspect of the cystic wall. This may potentially represent
an endometrioma or less likely hemorrhagic cyst. Possibility of
cystic neoplasm not entirely excluded although felt to be less
likely. Given these considerations, recommend further evaluation
with pelvic MRI.

These results will be called to the ordering clinician or
representative by the Radiologist Assistant, and communication
documented in the PACS or [REDACTED].

## 2020-07-30 NOTE — Progress Notes (Signed)
pelvi

## 2020-07-30 NOTE — Telephone Encounter (Signed)
Discussed results with patient.  We will proceed with pelvic MRI.  Order placed.  We will set her up with GYN, etc once results are in.

## 2020-07-30 NOTE — Telephone Encounter (Signed)
Patient states that she is retuning a call about [place] results.

## 2020-08-02 ENCOUNTER — Encounter: Payer: 59 | Admitting: Physician Assistant

## 2020-08-03 ENCOUNTER — Other Ambulatory Visit: Payer: 59

## 2020-08-18 ENCOUNTER — Telehealth: Payer: Self-pay | Admitting: Physician Assistant

## 2020-08-18 DIAGNOSIS — N838 Other noninflammatory disorders of ovary, fallopian tube and broad ligament: Secondary | ICD-10-CM

## 2020-08-18 DIAGNOSIS — N946 Dysmenorrhea, unspecified: Secondary | ICD-10-CM

## 2020-08-18 NOTE — Telephone Encounter (Signed)
Received insurance denial of coverage for her pelvic MRI. As such need to proceed with urgent referral to Gynecology for further assessment of ovarian mass. Please place referral and contact patient.

## 2020-08-18 NOTE — Addendum Note (Signed)
Addended by: Leonidas Romberg on: 08/18/2020 02:11 PM   Modules accepted: Orders

## 2020-08-18 NOTE — Telephone Encounter (Signed)
Called patient but no answer. Left vm message for patient to call the office back.

## 2020-08-22 ENCOUNTER — Ambulatory Visit
Admission: RE | Admit: 2020-08-22 | Discharge: 2020-08-22 | Disposition: A | Discharge status: Home / Self Care | Payer: 59 | Source: Ambulatory Visit | Attending: Physician Assistant | Admitting: Physician Assistant

## 2020-08-22 ENCOUNTER — Other Ambulatory Visit: Payer: Self-pay

## 2020-08-22 DIAGNOSIS — N838 Other noninflammatory disorders of ovary, fallopian tube and broad ligament: Secondary | ICD-10-CM

## 2020-08-22 IMAGING — MR MR PELVIS WO/W CM
9 of 11 series · 36 of 48 positions shown · IV contrast (multihance)
Comparison: None.

CLINICAL DATA: Evaluate adnexal cyst identified on recent
ultrasound.

EXAM:
MRI PELVIS WITHOUT AND WITH CONTRAST
TECHNIQUE: Multiplanar multisequence MR imaging of the pelvis was performed
both before and after administration of intravenous contrast.
CONTRAST:  15mL MULTIHANCE GADOBENATE DIMEGLUMINE 529 MG/ML IV SOLN

[Series 2: T2 · coronal · 5.0mm · 1.25mm/px · 1 of 31 slices shown (1 of 3)]
[im 1/31]
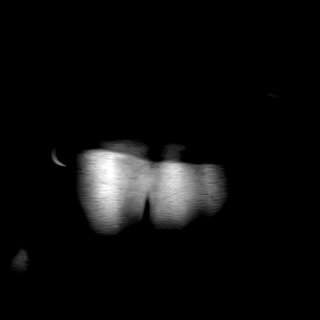

[Series 3: T2 · sagittal · 5.0mm · 0.78mm/px · 1 of 25 slices shown (2 of 3)]
[im 1/25]
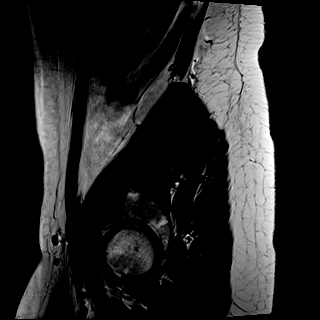

[Series 4: T2 · axial · 5.0mm · 0.41mm/px · z∈[-129,+86]mm · 2 of 37 slices shown (3 of 3)]
[im 1/37]
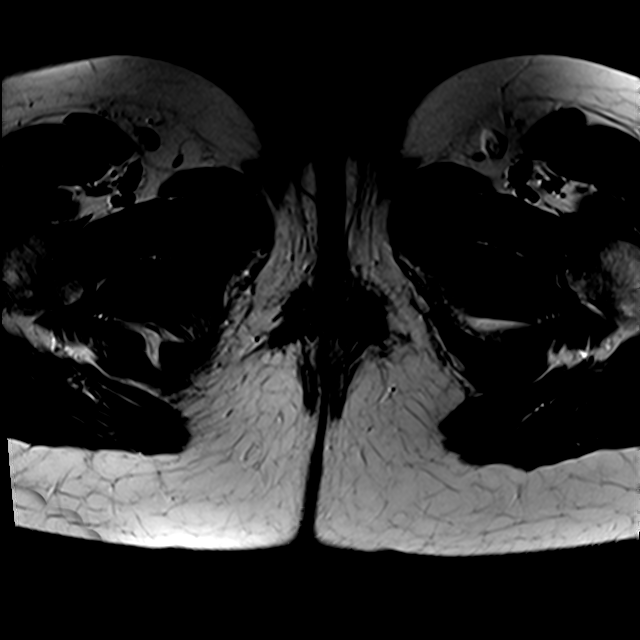
[im 37/37]
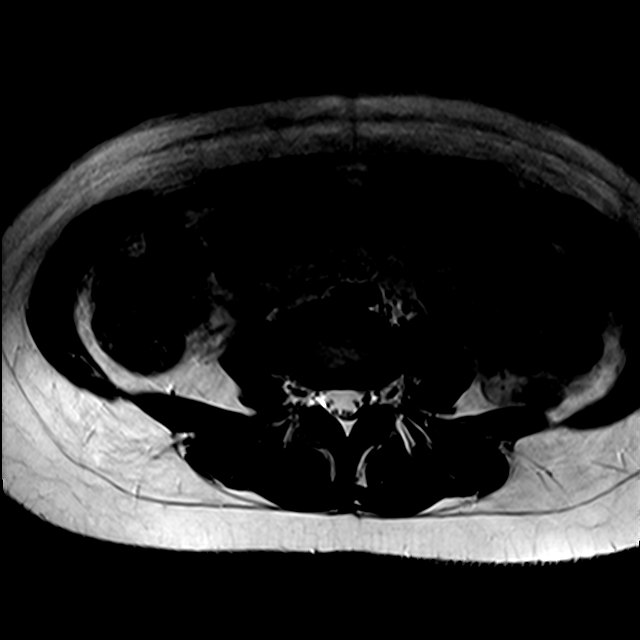

[Series 5: T1 · axial · 5.0mm · 1.25mm/px · z∈[-126,+109]mm · 5 of 96 slices shown (1 of 2)]
[im 1/96]
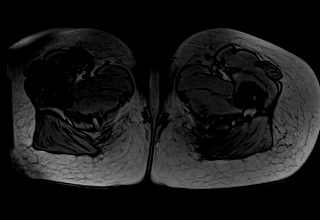
[im 24/96]
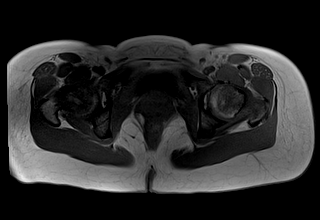
[im 48/96]
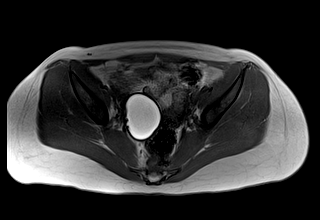
[im 72/96]
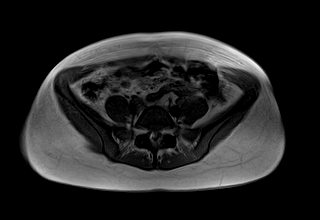
[im 96/96]
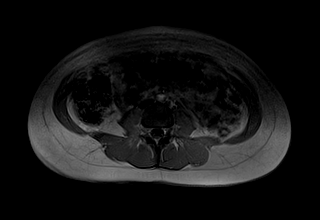

[Series 6: T2 fat-sat · axial · 5.0mm · 0.41mm/px · z∈[-129,+86]mm · 2 of 37 slices shown]
[im 1/37]
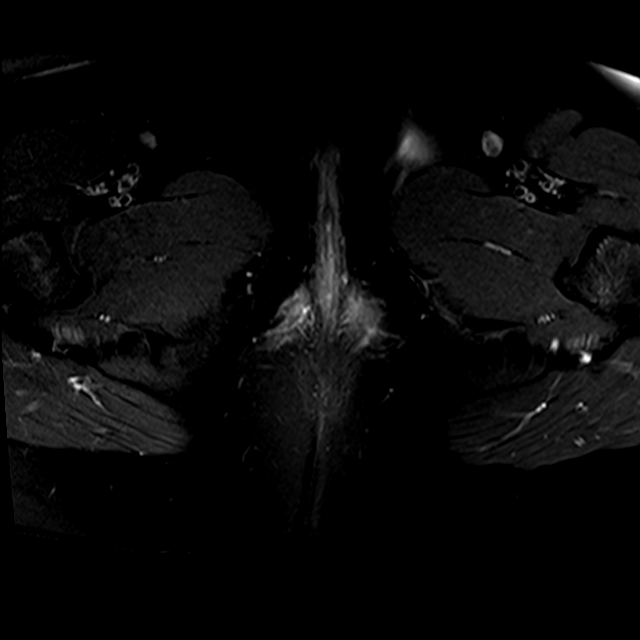
[im 37/37]
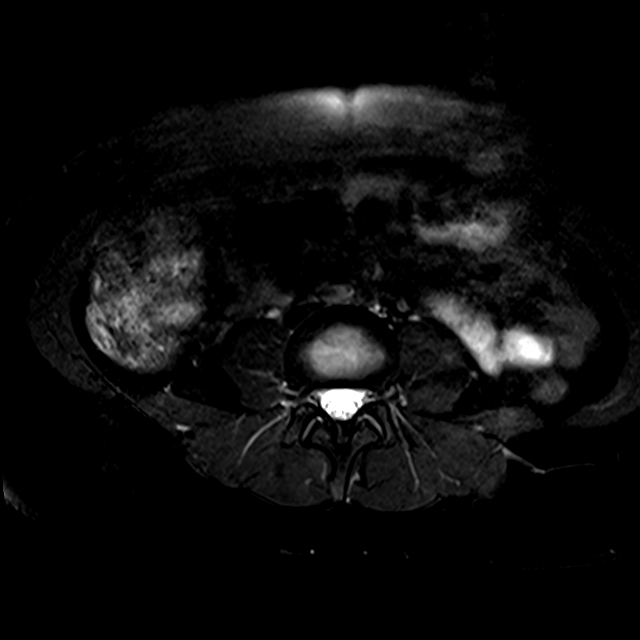

[Series 7: T1 · axial · 1.2mm · 0.75mm/px · z∈[-107,+64]mm · 7 of 144 slices shown (2 of 2)]
[im 1/144]
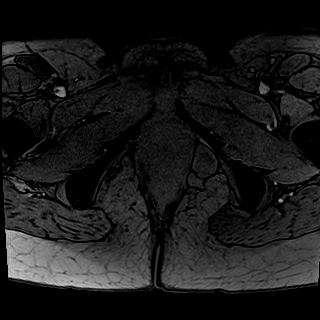
[im 24/144]
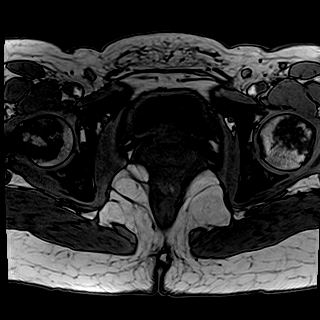
[im 48/144]
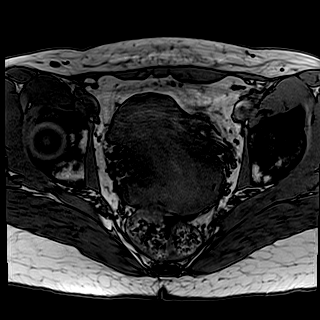
[im 72/144]
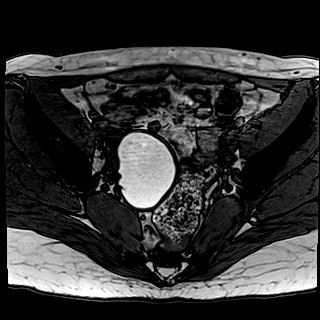
[im 96/144]
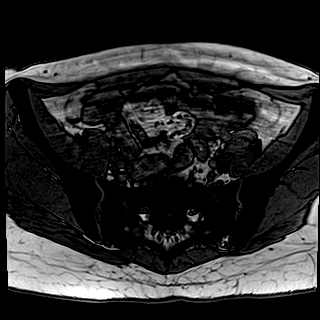
[im 120/144]
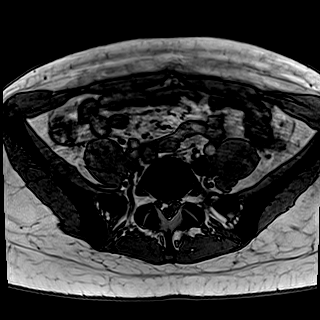
[im 144/144]
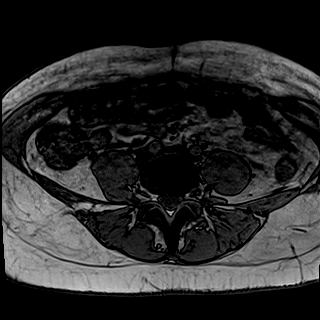

[Series 8: T1 fat-sat · axial · 1.2mm · 0.75mm/px · z∈[-107,+64]mm · 7 of 144 slices shown]
[im 1/144]
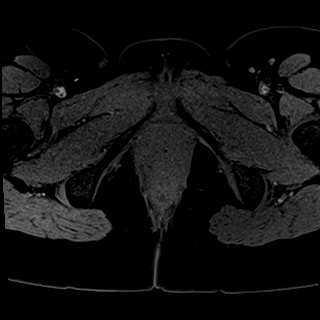
[im 24/144]
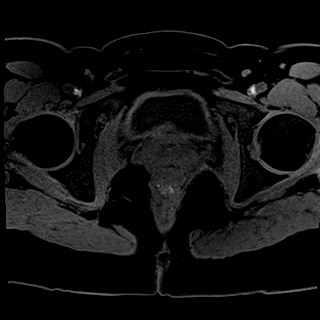
[im 48/144]
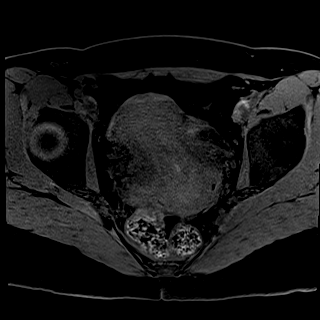
[im 72/144]
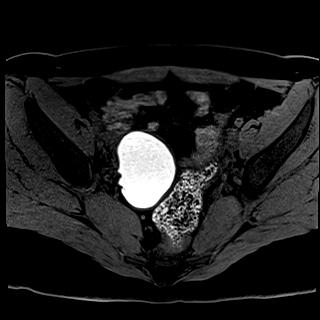
[im 96/144]
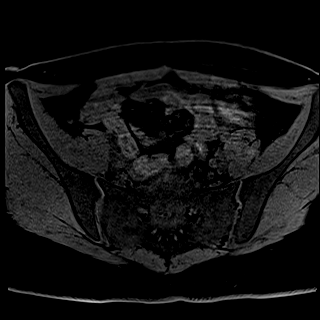
[im 120/144]
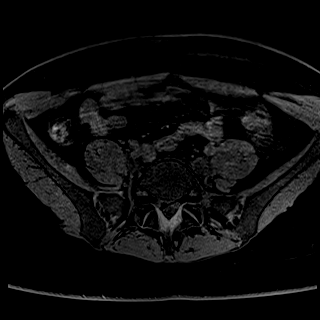
[im 144/144]
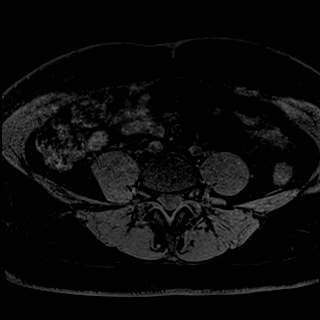

[Series 9: T1 fat-sat post-contrast · axial · 1.2mm · 0.75mm/px · z∈[-107,+64]mm · 7 of 144 slices shown (1 of 2)]
[im 1/144]
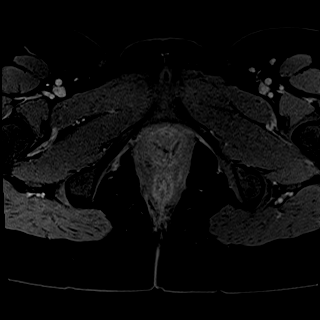
[im 24/144]
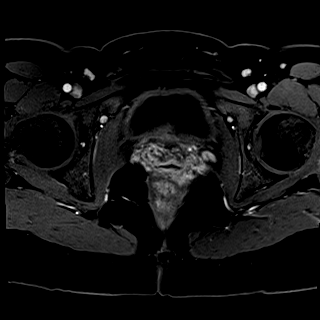
[im 48/144]
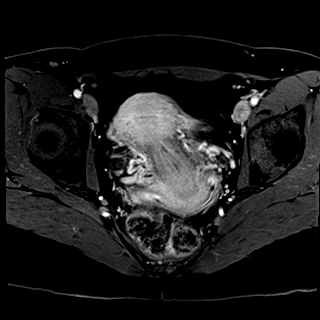
[im 72/144]
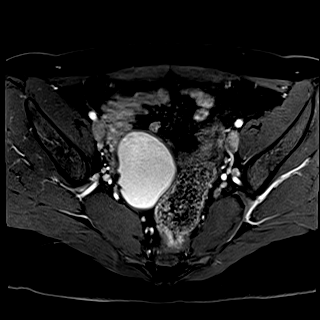
[im 96/144]
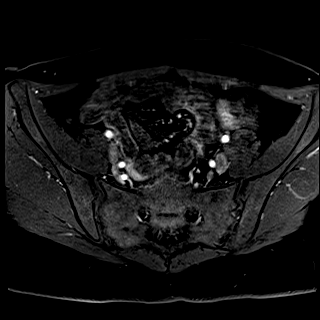
[im 120/144]
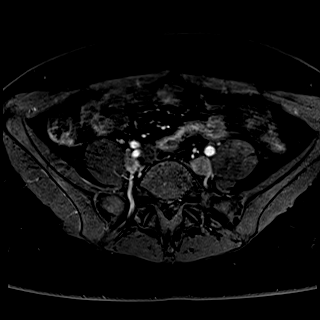
[im 144/144]
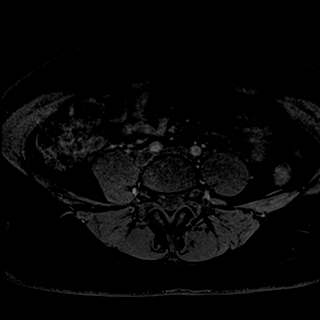

[Series 10: T1 fat-sat post-contrast · axial · 1.2mm · 0.75mm/px · z∈[-107,-22]mm · 4 of 144 slices shown (2 of 2)]
[im 1/144]
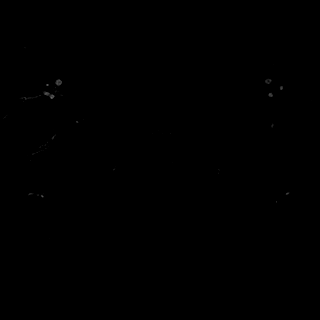
[im 24/144]
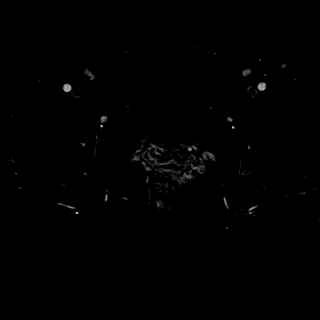
[im 48/144]
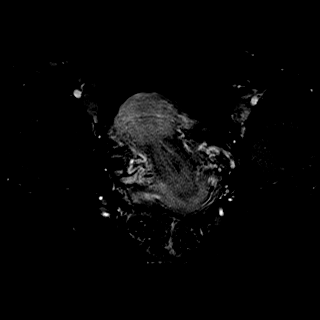
[im 72/144]
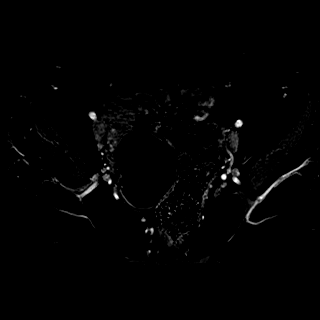

[36 of 48 positions shown; findings below may reference images not displayed]

FINDINGS: Urinary Tract:  No abnormality visualized.

Bowel:  Unremarkable visualized pelvic bowel loops.

Vascular/Lymphatic: No pathologically enlarged lymph nodes. No
significant vascular abnormality seen.

Reproductive:

Uterus: Measures 5.0 by 5.6 by 3.6 cm. No focal uterine mass
identified.

Other: Endometrium: Measures 4 mm in thickness. No focal abnormality
noted.

Right ovary: Measures 5.8 x 5.1 by 4.5 cm. There is a large T2
hyperintense and T1 hyperintense cystic structure within the right
ovary which measures 5.5 x 4.1 by 3.5 cm (volume = 41 cm^3). On
the previous ultrasound this dominant cyst was measured at 6.1 x
x 4.9 cm (volume = 55 cm^3). On today's study there are no
internal areas of septation. Mild mural irregularity is noted along
the medial wall of this lesion, image 75/8.

Left ovary: Measures 3.1 x 1.8 by 2.1 cm.

Musculoskeletal: No suspicious bone lesions identified.
IMPRESSION: 1. Again seen is a large complicated cyst within the right ovary.
This is mildly decreased in size when compared with [DATE]. The
signal characteristics of this lesion favor a hemorrhagic cyst. Mild
irregularity along the lateral wall of this lesion is noted which is
nonspecific but may be seen with cystic neoplasms of the ovaries.
There are no internal areas of septation. This is favored to
represent a benign hemorrhagic cyst. However, given the size of this
lesion and the presence of mild mural irregularity, recommend
follow-up pelvic ultrasound in 6 weeks to ensure continued reduction
in size. In the absence of interval reduction in size of this lesion
surgical resection should be considered.
2. Normal appearance of the uterus.

## 2020-08-22 MED ORDER — GADOBENATE DIMEGLUMINE 529 MG/ML IV SOLN
15.0000 mL | Freq: Once | INTRAVENOUS | Status: AC | PRN
  Administered 2020-08-22: 15 mL via INTRAVENOUS

## 2020-08-24 ENCOUNTER — Other Ambulatory Visit: Payer: Self-pay

## 2020-08-24 DIAGNOSIS — N83201 Unspecified ovarian cyst, right side: Secondary | ICD-10-CM

## 2020-09-02 ENCOUNTER — Other Ambulatory Visit: Payer: Self-pay

## 2020-09-02 ENCOUNTER — Encounter: Payer: Self-pay | Admitting: Physician Assistant

## 2020-09-02 ENCOUNTER — Ambulatory Visit (INDEPENDENT_AMBULATORY_CARE_PROVIDER_SITE_OTHER): Payer: 59 | Admitting: Physician Assistant

## 2020-09-02 VITALS — BP 100/70 | HR 70 | Temp 98.4°F | Resp 14 | Ht 65.0 in | Wt 161.0 lb

## 2020-09-02 DIAGNOSIS — G4700 Insomnia, unspecified: Secondary | ICD-10-CM

## 2020-09-02 DIAGNOSIS — Z1159 Encounter for screening for other viral diseases: Secondary | ICD-10-CM

## 2020-09-02 DIAGNOSIS — Z0000 Encounter for general adult medical examination without abnormal findings: Secondary | ICD-10-CM

## 2020-09-02 LAB — URINALYSIS, ROUTINE W REFLEX MICROSCOPIC
Bilirubin Urine: NEGATIVE
Hgb urine dipstick: NEGATIVE
Ketones, ur: NEGATIVE
Leukocytes,Ua: NEGATIVE
Nitrite: NEGATIVE
RBC / HPF: NONE SEEN (ref 0–?)
Specific Gravity, Urine: 1.02 (ref 1.000–1.030)
Total Protein, Urine: NEGATIVE
Urine Glucose: NEGATIVE
Urobilinogen, UA: 0.2 (ref 0.0–1.0)
pH: 6 (ref 5.0–8.0)

## 2020-09-02 LAB — CBC WITH DIFFERENTIAL/PLATELET
Basophils Absolute: 0 10*3/uL (ref 0.0–0.1)
Basophils Relative: 0.6 % (ref 0.0–3.0)
Eosinophils Absolute: 0.1 10*3/uL (ref 0.0–0.7)
Eosinophils Relative: 1.7 % (ref 0.0–5.0)
HCT: 35.4 % — ABNORMAL LOW (ref 36.0–46.0)
Hemoglobin: 11.9 g/dL — ABNORMAL LOW (ref 12.0–15.0)
Lymphocytes Relative: 32.3 % (ref 12.0–46.0)
Lymphs Abs: 2.5 10*3/uL (ref 0.7–4.0)
MCHC: 33.8 g/dL (ref 30.0–36.0)
MCV: 88.1 fl (ref 78.0–100.0)
Monocytes Absolute: 0.6 10*3/uL (ref 0.1–1.0)
Monocytes Relative: 8 % (ref 3.0–12.0)
Neutro Abs: 4.5 10*3/uL (ref 1.4–7.7)
Neutrophils Relative %: 57.4 % (ref 43.0–77.0)
Platelets: 298 10*3/uL (ref 150.0–400.0)
RBC: 4.02 Mil/uL (ref 3.87–5.11)
RDW: 13 % (ref 11.5–15.5)
WBC: 7.8 10*3/uL (ref 4.0–10.5)

## 2020-09-02 LAB — COMPREHENSIVE METABOLIC PANEL
ALT: 16 U/L (ref 0–35)
AST: 13 U/L (ref 0–37)
Albumin: 4 g/dL (ref 3.5–5.2)
Alkaline Phosphatase: 32 U/L — ABNORMAL LOW (ref 39–117)
BUN: 8 mg/dL (ref 6–23)
CO2: 27 mEq/L (ref 19–32)
Calcium: 8.9 mg/dL (ref 8.4–10.5)
Chloride: 103 mEq/L (ref 96–112)
Creatinine, Ser: 0.53 mg/dL (ref 0.40–1.20)
GFR: 124.64 mL/min (ref >60.00)
Glucose, Bld: 73 mg/dL (ref 70–99)
Potassium: 4 mEq/L (ref 3.5–5.1)
Sodium: 137 mEq/L (ref 135–145)
Total Bilirubin: 0.4 mg/dL (ref 0.2–1.2)
Total Protein: 6.7 g/dL (ref 6.0–8.3)

## 2020-09-02 LAB — LIPID PANEL
Cholesterol: 175 mg/dL (ref 0–200)
HDL: 46.8 mg/dL (ref >39.00)
LDL Cholesterol: 107 mg/dL — ABNORMAL HIGH (ref 0–99)
NonHDL: 128.52
Total CHOL/HDL Ratio: 4
Triglycerides: 108 mg/dL (ref 0.0–149.0)
VLDL: 21.6 mg/dL (ref 0.0–40.0)

## 2020-09-02 LAB — TSH: TSH: 1.19 u[IU]/mL (ref 0.35–4.50)

## 2020-09-02 LAB — VITAMIN D 25 HYDROXY (VIT D DEFICIENCY, FRACTURES): VITD: 20.51 ng/mL — ABNORMAL LOW (ref 30.00–100.00)

## 2020-09-02 LAB — HEMOGLOBIN A1C: Hgb A1c MFr Bld: 5.3 % (ref 4.6–6.5)

## 2020-09-02 NOTE — Patient Instructions (Signed)
Please go to the lab today for blood work.  I will call you with your results. We will alter treatment regimen(s) if indicated by your results.   Sleep Hygiene  Do: (1) Go to bed at the same time each day. (2) Get up from bed at the same time each day. (3) Get regular exercise each day, preferably in the morning.  There is goof evidence that regular exercise improves restful sleep.  This includes stretching and aerobic exercise. (4) Get regular exposure to outdoor or bright lights, especially in the late afternoon. (5) Keep the temperature in your bedroom comfortable. (6) Keep the bedroom quiet when sleeping. (7) Keep the bedroom dark enough to facilitate sleep. (8) Use your bed only for sleep and sex. (9) Take medications as directed.  It is helpful to take prescribed sleeping pills 1 hour before bedtime, so they are causing drowsiness when you lie down, or 10 hours before getting up, to avoid daytime drowsiness. (10) Use a relaxation exercise just before going to sleep -- imagery, massage, warm bath. (11) Keep your feet and hands warm.  Wear warm socks and/or mittens or gloves to bed.  Don't: (1) Exercise just before going to bed. (2) Engage in stimulating activity just before bed, such as playing a competitive game, watching an exciting program on television, or having an important discussion with a loved one. (3) Have caffeine in the evening (coffee, teas, chocolate, sodas, etc.) (4) Read or watch television in bed. (5) Use alcohol to help you sleep. (6) Go to bed too hungry or too full. (7) Take another person's sleeping pills. (8) Take over-the-counter sleeping pills, without your doctor's knowledge.  Tolerance can develop rapidly with these medications.  Diphenhydramine can have serious side effects for elderly patients. (9) Take daytime naps. (10) Command yourself to go to sleep.  This only makes your mind and body more alert.  If you lie awake for more than 20-30 minutes, get  up, go to a different room, participate in a quiet activity (Ex - non-excitable reading or television), and then return to bed when you feel sleepy.  Do this as many times during the night as needed.  This may cause you to have a night or two of poor sleep but it will train your brain to know when it is time for sleep.    Preventive Care 6-11 Years Old, Female Preventive care refers to visits with your health care provider and lifestyle choices that can promote health and wellness. This includes:  A yearly physical exam. This may also be called an annual well check.  Regular dental visits and eye exams.  Immunizations.  Screening for certain conditions.  Healthy lifestyle choices, such as eating a healthy diet, getting regular exercise, not using drugs or products that contain nicotine and tobacco, and limiting alcohol use. What can I expect for my preventive care visit? Physical exam Your health care provider will check your:  Height and weight. This may be used to calculate body mass index (BMI), which tells if you are at a healthy weight.  Heart rate and blood pressure.  Skin for abnormal spots. Counseling Your health care provider may ask you questions about your:  Alcohol, tobacco, and drug use.  Emotional well-being.  Home and relationship well-being.  Sexual activity.  Eating habits.  Work and work Astronomer.  Method of birth control.  Menstrual cycle.  Pregnancy history. What immunizations do I need?  Influenza (flu) vaccine  This is recommended every year.  Tetanus, diphtheria, and pertussis (Tdap) vaccine  You may need a Td booster every 10 years. Varicella (chickenpox) vaccine  You may need this if you have not been vaccinated. Human papillomavirus (HPV) vaccine  If recommended by your health care provider, you may need three doses over 6 months. Measles, mumps, and rubella (MMR) vaccine  You may need at least one dose of MMR. You may also  need a second dose. Meningococcal conjugate (MenACWY) vaccine  One dose is recommended if you are age 39-21 years and a first-year college student living in a residence hall, or if you have one of several medical conditions. You may also need additional booster doses. Pneumococcal conjugate (PCV13) vaccine  You may need this if you have certain conditions and were not previously vaccinated. Pneumococcal polysaccharide (PPSV23) vaccine  You may need one or two doses if you smoke cigarettes or if you have certain conditions. Hepatitis A vaccine  You may need this if you have certain conditions or if you travel or work in places where you may be exposed to hepatitis A. Hepatitis B vaccine  You may need this if you have certain conditions or if you travel or work in places where you may be exposed to hepatitis B. Haemophilus influenzae type b (Hib) vaccine  You may need this if you have certain conditions. You may receive vaccines as individual doses or as more than one vaccine together in one shot (combination vaccines). Talk with your health care provider about the risks and benefits of combination vaccines. What tests do I need?  Blood tests  Lipid and cholesterol levels. These may be checked every 5 years starting at age 34.  Hepatitis C test.  Hepatitis B test. Screening  Diabetes screening. This is done by checking your blood sugar (glucose) after you have not eaten for a while (fasting).  Sexually transmitted disease (STD) testing.  BRCA-related cancer screening. This may be done if you have a family history of breast, ovarian, tubal, or peritoneal cancers.  Pelvic exam and Pap test. This may be done every 3 years starting at age 36. Starting at age 77, this may be done every 5 years if you have a Pap test in combination with an HPV test. Talk with your health care provider about your test results, treatment options, and if necessary, the need for more tests. Follow these  instructions at home: Eating and drinking   Eat a diet that includes fresh fruits and vegetables, whole grains, lean protein, and low-fat dairy.  Take vitamin and mineral supplements as recommended by your health care provider.  Do not drink alcohol if: ? Your health care provider tells you not to drink. ? You are pregnant, may be pregnant, or are planning to become pregnant.  If you drink alcohol: ? Limit how much you have to 0-1 drink a day. ? Be aware of how much alcohol is in your drink. In the [place] one drink equals one 12 oz bottle of beer (355 mL), one 5 oz glass of wine (148 mL), or one 1 oz glass of hard liquor (44 mL). Lifestyle  Take daily care of your teeth and gums.  Stay active. Exercise for at least 30 minutes on 5 or more days each week.  Do not use any products that contain nicotine or tobacco, such as cigarettes, e-cigarettes, and chewing tobacco. If you need help quitting, ask your health care provider.  If you are sexually active, practice safe sex. Use a condom or other  form of birth control (contraception) in order to prevent pregnancy and STIs (sexually transmitted infections). If you plan to become pregnant, see your health care provider for a preconception visit. What's next?  Visit your health care provider once a year for a well check visit.  Ask your health care provider how often you should have your eyes and teeth checked.  Stay up to date on all vaccines. This information is not intended to replace advice given to you by your health care provider. Make sure you discuss any questions you have with your health care provider. Document Revised: 06/27/2018 Document Reviewed: 06/27/2018 Elsevier Patient Education  2020 [employer].

## 2020-09-02 NOTE — Progress Notes (Signed)
Patient presents to clinic today for annual exam.  Patient is fasting for labs. Is keeping a well-balanced diet. Avoiding larger portion sizes. Notes good sleep onset but has hard time with sleep maintenance. Has tried OTC Melatonin at larger doses which made her too groggy.  Health Maintenance: Immunizations -- up-to-date PAP -- Due. Has appt scheduled with GYN.   Past Medical History:  Diagnosis Date  . Allergy   . Anemia   . Anxiety     Past Surgical History:  Procedure Laterality Date  . NO PAST SURGERIES      Current Outpatient Medications on File Prior to Visit  Medication Sig Dispense Refill  . ethynodiol-ethinyl estradiol (ZOVIA) 1-35 MG-MCG tablet Take 1 tablet by mouth daily. 28 tablet 11  . Mefenamic Acid 250 MG CAPS Take 1 capsule (250 mg total) by mouth 4 (four) times daily as needed. 28 capsule 0   No current facility-administered medications on file prior to visit.    Allergies  Allergen Reactions  . Fluconazole Rash    Family History  Problem Relation Age of Onset  . Rheum arthritis Father   . Cancer Maternal Grandfather        Lung  . Cancer Paternal Grandfather        Prostate    Social History   Socioeconomic History  . Marital status: Single    Spouse name: Not on file  . Number of children: Not on file  . Years of education: Not on file  . Highest education level: Not on file  Occupational History  . Not on file  Tobacco Use  . Smoking status: Never Smoker  . Smokeless tobacco: Never Used  Vaping Use  . Vaping Use: Never used  Substance and Sexual Activity  . Alcohol use: No  . Drug use: No  . Sexual activity: Yes    Birth control/protection: Condom, Pill  Other Topics Concern  . Not on file  Social History Narrative  . Not on file   Social Determinants of Health   Financial Resource Strain:   . Difficulty of Paying Living Expenses: Not on file  Food Insecurity:   . Worried About Charity fundraiser in the Last Year: Not  on file  . Ran Out of Food in the Last Year: Not on file  Transportation Needs:   . Lack of Transportation (Medical): Not on file  . Lack of Transportation (Non-Medical): Not on file  Physical Activity:   . Days of Exercise per Week: Not on file  . Minutes of Exercise per Session: Not on file  Stress:   . Feeling of Stress : Not on file  Social Connections:   . Frequency of Communication with Friends and Family: Not on file  . Frequency of Social Gatherings with Friends and Family: Not on file  . Attends Religious Services: Not on file  . Active Member of Clubs or Organizations: Not on file  . Attends Archivist Meetings: Not on file  . Marital Status: Not on file  Intimate Partner Violence:   . Fear of Current or Ex-Partner: Not on file  . Emotionally Abused: Not on file  . Physically Abused: Not on file  . Sexually Abused: Not on file   Review of Systems  Constitutional: Negative for fever and weight loss.  HENT: Negative for ear discharge, ear pain, hearing loss and tinnitus.   Eyes: Negative for blurred vision, double vision, photophobia and pain.  Respiratory: Negative for cough  and shortness of breath.   Cardiovascular: Negative for chest pain and palpitations.  Gastrointestinal: Negative for abdominal pain, blood in stool, constipation, diarrhea, heartburn, melena, nausea and vomiting.  Genitourinary: Negative for dysuria, flank pain, frequency, hematuria and urgency.  Musculoskeletal: Negative for falls.  Neurological: Negative for dizziness, loss of consciousness and headaches.  Endo/Heme/Allergies: Negative for environmental allergies.  Psychiatric/Behavioral: Negative for depression, hallucinations, substance abuse and suicidal ideas. The patient has insomnia. The patient is not nervous/anxious.    BP 100/70   Pulse 70   Temp 98.4 F (36.9 C) (Temporal)   Resp 14   Ht 5\' 5"  (1.651 m)   Wt 161 lb (73 kg)   SpO2 99%   BMI 26.79 kg/m   Physical  Exam Vitals reviewed.  HENT:     Head: Normocephalic and atraumatic.     Right Ear: Tympanic membrane, ear canal and external ear normal.     Left Ear: Tympanic membrane, ear canal and external ear normal.     Nose: Nose normal. No mucosal edema.     Mouth/Throat:     Pharynx: Uvula midline. No oropharyngeal exudate or posterior oropharyngeal erythema.  Eyes:     Conjunctiva/sclera: Conjunctivae normal.     Pupils: Pupils are equal, round, and reactive to light.  Neck:     Thyroid: No thyromegaly.  Cardiovascular:     Rate and Rhythm: Normal rate and regular rhythm.     Heart sounds: Normal heart sounds.  Pulmonary:     Effort: Pulmonary effort is normal. No respiratory distress.     Breath sounds: Normal breath sounds. No wheezing or rales.  Abdominal:     General: Bowel sounds are normal. There is no distension.     Palpations: Abdomen is soft. There is no mass.     Tenderness: There is no abdominal tenderness. There is no guarding or rebound.  Musculoskeletal:     Cervical back: Neck supple.  Lymphadenopathy:     Cervical: No cervical adenopathy.  Skin:    General: Skin is warm and dry.     Findings: No rash.  Neurological:     Mental Status: She is alert and oriented to person, place, and time.     Cranial Nerves: No cranial nerve deficit.    Recent Results (from the past 2160 hour(s))  POCT urine pregnancy     Status: Normal   Collection Time: 07/07/20 11:27 AM  Result Value Ref Range   Preg Test, Ur Negative Negative   Assessment/Plan: 1. Visit for preventive health examination Depression screen negative. Health Maintenance reviewed -- immunizations up-to-date. Preventive schedule discussed and handout given in AVS. Will obtain fasting labs today.  - Comprehensive metabolic panel - CBC with Differential/Platelet - Hemoglobin A1c - Lipid panel - TSH - Vitamin D (25 hydroxy) - HIV Antibody (routine testing w rflx) - Urinalysis, Routine w reflex  microscopic  2. Need for hepatitis C screening test - Hepatitis C Antibody  3. Insomnia, unspecified type Sleep hygiene practices and OTC medications reviewed. Will monitor.    This visit occurred during the SARS-CoV-2 public health emergency.  Safety protocols were in place, including screening questions prior to the visit, additional usage of staff PPE, and extensive cleaning of exam room while observing appropriate contact time as indicated for disinfecting solutions.    Leeanne Rio, PA-C

## 2020-09-03 ENCOUNTER — Other Ambulatory Visit: Payer: Self-pay | Admitting: Emergency Medicine

## 2020-09-03 DIAGNOSIS — E559 Vitamin D deficiency, unspecified: Secondary | ICD-10-CM

## 2020-09-03 MED ORDER — VITAMIN D-3 25 MCG (1000 UT) PO CAPS: 1.0000 | ORAL_CAPSULE | Freq: Every day | ORAL | 0 refills | Status: DC

## 2020-09-03 MED ORDER — VITAMIN D (ERGOCALCIFEROL) 1.25 MG (50000 UNIT) PO CAPS: ORAL_CAPSULE | ORAL | 2 refills | Status: DC

## 2020-09-06 LAB — HIV ANTIBODY (ROUTINE TESTING W REFLEX): HIV 1&2 Ab, 4th Generation: NONREACTIVE

## 2020-09-06 LAB — HEPATITIS C ANTIBODY
Hepatitis C Ab: NONREACTIVE
SIGNAL TO CUT-OFF: 0.01 (ref <1.00)

## 2020-09-09 ENCOUNTER — Ambulatory Visit: Payer: BLUE CROSS/BLUE SHIELD | Admitting: Obstetrics and Gynecology

## 2020-09-16 ENCOUNTER — Other Ambulatory Visit: Payer: 59

## 2020-10-07 ENCOUNTER — Ambulatory Visit: Payer: 59 | Admitting: Obstetrics and Gynecology

## 2020-10-07 ENCOUNTER — Encounter: Payer: Self-pay | Admitting: Obstetrics and Gynecology

## 2020-10-07 ENCOUNTER — Other Ambulatory Visit: Payer: Self-pay

## 2020-10-07 VITALS — BP 110/72 | Ht 65.0 in | Wt 157.0 lb

## 2020-10-07 DIAGNOSIS — N83201 Unspecified ovarian cyst, right side: Secondary | ICD-10-CM

## 2020-10-07 DIAGNOSIS — R102 Pelvic and perineal pain: Secondary | ICD-10-CM

## 2020-10-07 DIAGNOSIS — Z01419 Encounter for gynecological examination (general) (routine) without abnormal findings: Secondary | ICD-10-CM | POA: Diagnosis not present

## 2020-10-07 DIAGNOSIS — Z124 Encounter for screening for malignant neoplasm of cervix: Secondary | ICD-10-CM | POA: Diagnosis not present

## 2020-10-07 NOTE — Progress Notes (Signed)
Jill Stafford 09/02/1991 [id number]  SUBJECTIVE:  29 y.o. G0P0000 female new patient presents for annual routine gynecologic exam and Pap smear. She presented to her PCP with pelvic pain and had a pelvic ultrasound 07/30/2020 normal-sized uterus and 6.1 x 3.5 x 4.9 cm complex cystic structure on the right ovary, her left ovary was normal.  She was started on birth control pills a few months ago and pelvic pain has gotten much better.  He did have recurrence of worsening pain again for about a week in the midline pelvis proceeding her period which then got better on its own.  Denies any abnormal vaginal discharge or bleeding.  She has managed pain with ibuprofen or mefenamic acid.  Just starting her period yesterday.  Not currently sexually active in the last 7 months or so.  Recently moved here from [place].   Current Outpatient Medications  Medication Sig Dispense Refill  . Cholecalciferol (VITAMIN D-3) 25 MCG (1000 UT) CAPS Take 1 capsule (1,000 Units total) by mouth daily. 30 capsule 0  . ethynodiol-ethinyl estradiol (ZOVIA) 1-35 MG-MCG tablet Take 1 tablet by mouth daily. 28 tablet 11  . Mefenamic Acid 250 MG CAPS Take 1 capsule (250 mg total) by mouth 4 (four) times daily as needed. 28 capsule 0  . Vitamin D, Ergocalciferol, (DRISDOL) 1.25 MG (50000 UNIT) CAPS capsule Take once capsule by mouth weekly for 12 weeks. 4 capsule 2   No current facility-administered medications for this visit.   Allergies: Fluconazole  Patient's last menstrual period was 10/06/2020.  Past medical history,surgical history, problem list, medications, allergies, family history and social history were all reviewed and documented as reviewed in the EPIC chart.  ROS: Pertinent positives and negatives as reviewed in HPI   OBJECTIVE:  BP 110/72 (BP Location: Right Arm, Patient Position: Sitting, Cuff Size: Normal)   Ht 5\' 5"  (1.651 m)   Wt 157 lb (71.2 kg)   LMP 10/06/2020   BMI 26.13 kg/m  The patient  appears well, alert, oriented, in no distress. ENT normal.  Neck supple. No cervical or supraclavicular adenopathy or thyromegaly.  Lungs are clear, good air entry, no wheezes, rhonchi or rales. S1 and S2 normal, no murmurs, regular rate and rhythm.  Abdomen soft without tenderness, guarding, mass or organomegaly.  Neurological is normal, no focal findings.  BREAST EXAM: breasts appear normal, no suspicious masses, no skin or nipple changes or axillary nodes  PELVIC EXAM: VULVA: normal appearing vulva with no masses, tenderness or lesions, VAGINA: normal appearing vagina with normal color and small amount of menstrual blood present, no lesions, CERVIX: normal appearing cervix without discharge or lesions, UTERUS: uterus is normal size, shape, consistency and nontender, ADNEXA: Fullness on right and mildly tender in this area, left adnexa is normal in size and nontender, PAP: Pap smear done today, thin-prep method  Chaperone: Wandra Scot Bonham present during the examination  ASSESSMENT:  29 y.o. G0P0000 here for annual gynecologic exam  PLAN:   1.  Pelvic pain with right ovarian cyst.  The patient indicates that she does have follow-up pelvic ultrasound to evaluate the status of the cyst which is scheduled 10/14/2020.  If she has any further concerns that her PCP would like assistance with she certainly can return for recommendations.  Pelvic pain does seem to be getting better on the birth control pill.  We discussed possibility of continuous dosing if pain control is adequate.  We briefly discussed endometriosis as another possibility today and some of the  considerations with that condition.   2. Pap smear like to today.  She says her last Pap smear was probably 4 to 5 years ago.  She indicates no prior abnormal Pap smears. 3. Contraception.  Currently on Zovia equivalent per her PCP. 4. Breast exam normal.  Denies any family history of gynecologic or breast cancers.  Encouraged SBE/breast  awareness. 5.  Discussed HPV vaccination today and the benefits and the newer clearance for administration beyond age 59.  She would like to think about it and she will let us know if she wants to proceed with HPV vaccination. 6. Health maintenance.  No labs today as she normally has these completed with her primary care provider.    Return annually or sooner, prn.  Joseph Pierini MD 10/07/20

## 2020-10-07 NOTE — Addendum Note (Signed)
Addended by: Joseph Pierini D on: 10/07/2020 05:02 PM   Modules accepted: Level of Service

## 2020-10-11 LAB — PAP IG W/ RFLX HPV ASCU

## 2020-10-14 ENCOUNTER — Ambulatory Visit
Admission: RE | Admit: 2020-10-14 | Discharge: 2020-10-14 | Disposition: A | Discharge status: Home / Self Care | Payer: PRIVATE HEALTH INSURANCE | Source: Ambulatory Visit | Attending: Physician Assistant | Admitting: Physician Assistant

## 2020-10-14 DIAGNOSIS — N83201 Unspecified ovarian cyst, right side: Secondary | ICD-10-CM

## 2020-10-14 IMAGING — US US PELVIS COMPLETE WITH TRANSVAGINAL
1 series · 13 of 25 positions shown · non-contrast
Comparison: Ultrasound [DATE], MRI [DATE]

CLINICAL DATA: Ovarian cyst follow-up

EXAM:
TRANSABDOMINAL AND TRANSVAGINAL ULTRASOUND OF PELVIS
TECHNIQUE: Both transabdominal and transvaginal ultrasound examinations of the
pelvis were performed. Transabdominal technique was performed for
global imaging of the pelvis including uterus, ovaries, adnexal
regions, and pelvic cul-de-sac. It was necessary to proceed with
endovaginal exam following the transabdominal exam to visualize the
uterus endometrium ovaries.

[Series 1: us pelvis complete with transvaginal · 0.25mm/px · 13 of 79 slices shown]
[im 1/79]
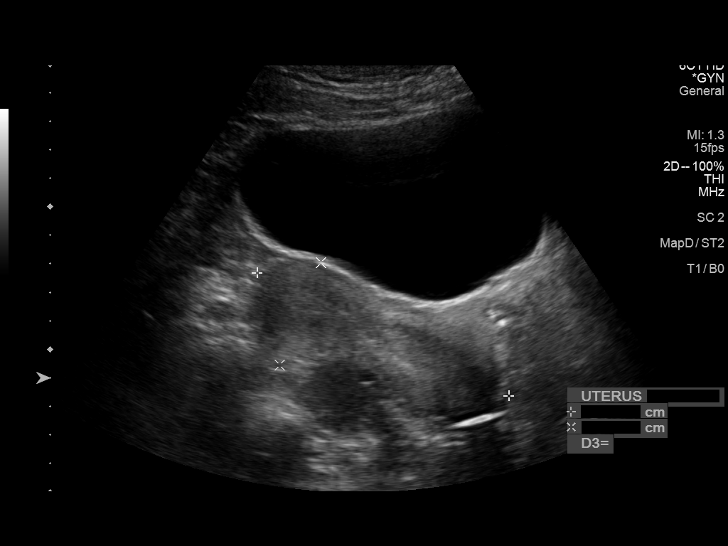
[im 7/79]
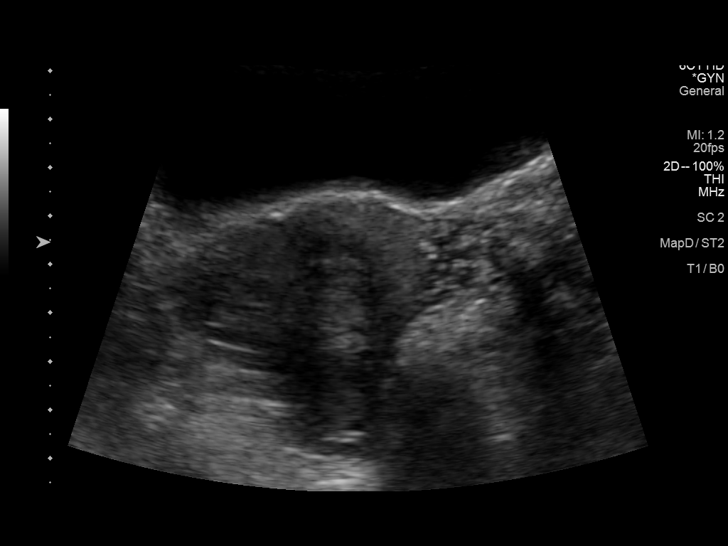
[im 14/79]
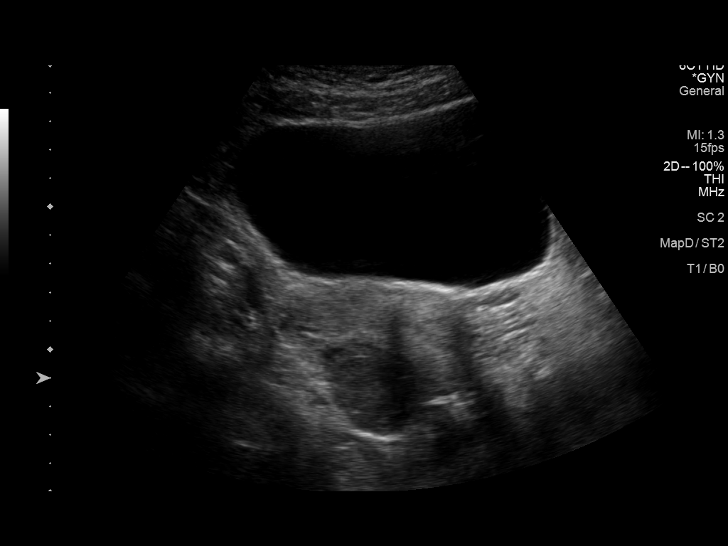
[im 20/79]
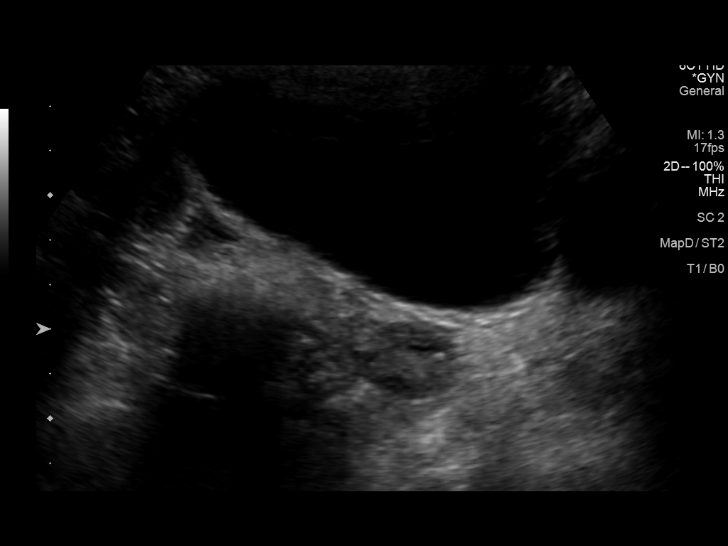
[im 27/79]
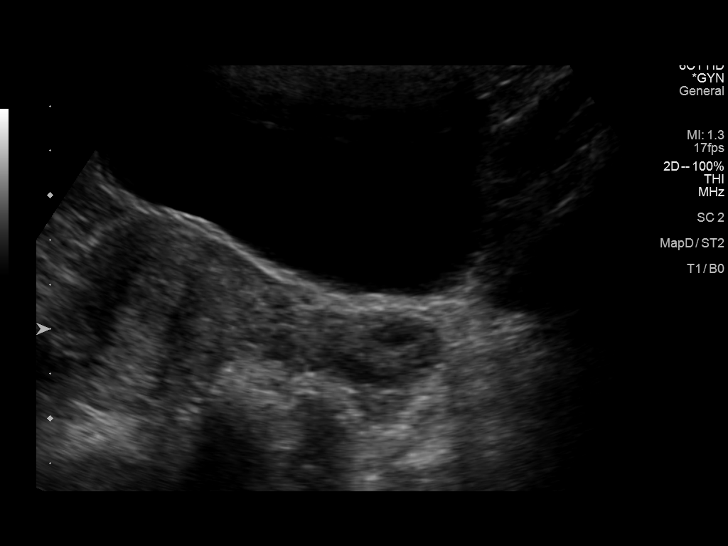
[im 33/79]
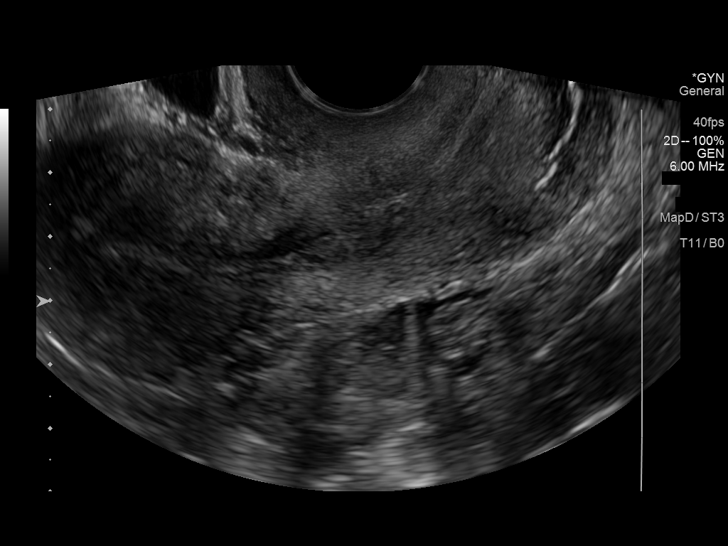
[im 40/79]
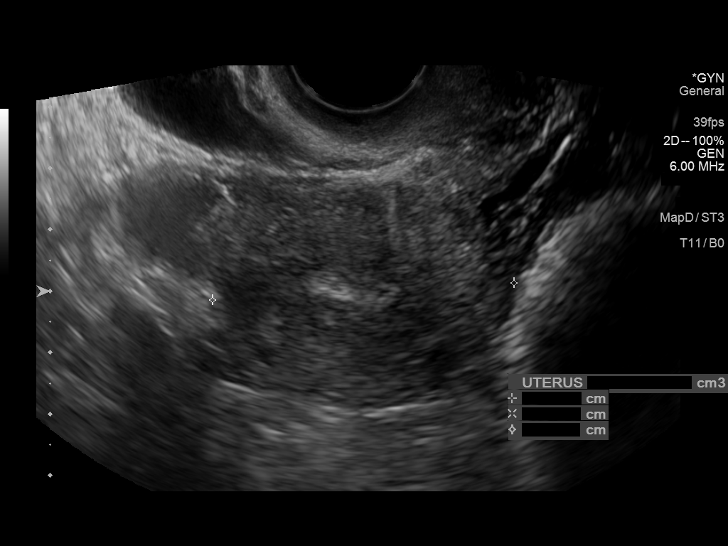
[im 46/79]
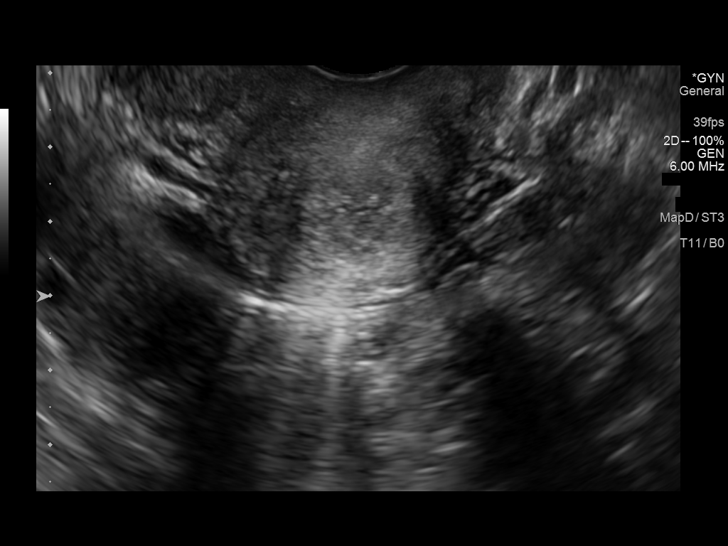
[im 53/79]
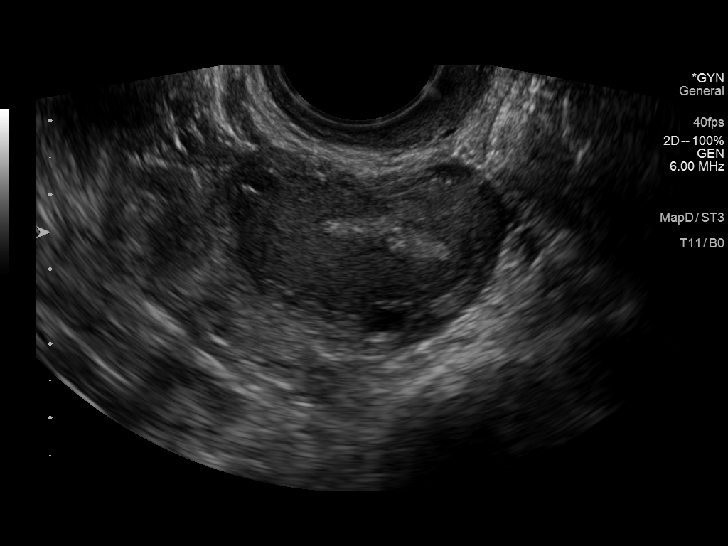
[im 59/79]
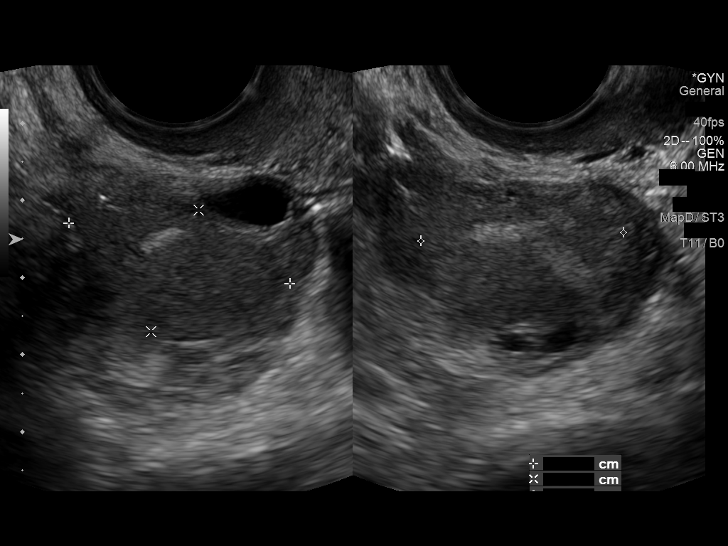
[im 66/79]
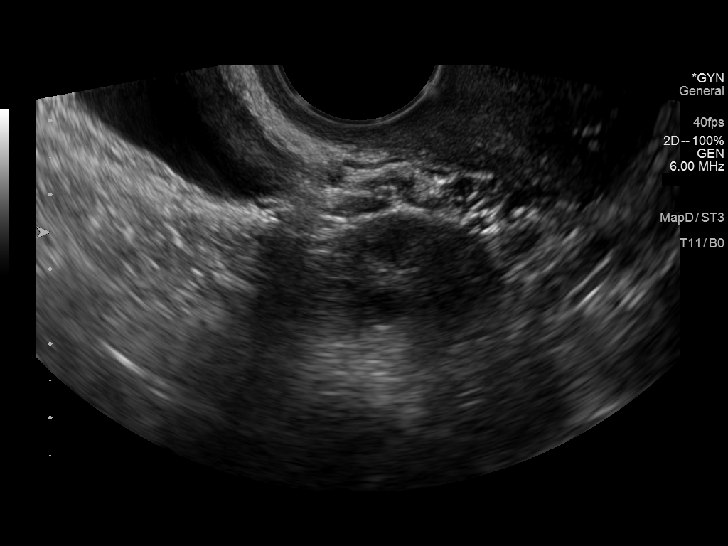
[im 72/79]
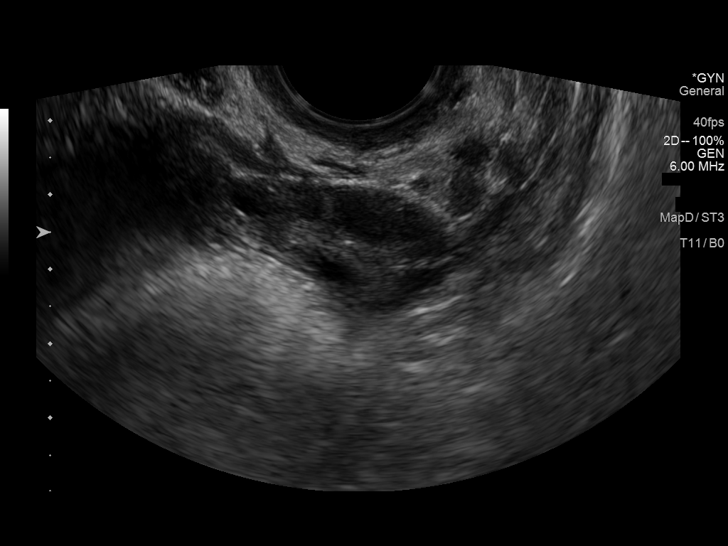
[im 79/79]
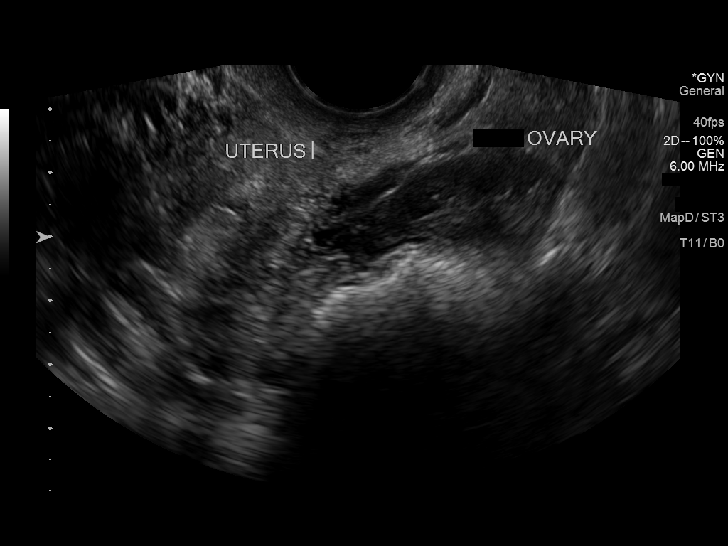

[13 of 25 positions shown; findings below may reference images not displayed]

FINDINGS: Uterus

Measurements: 9.8 x 3.9 x 4.6 cm = volume: 91 mL. No fibroids or
other mass visualized.

Endometrium

Thickness: 7.7 mm.  No focal abnormality visualized.

Right ovary

Measurements: 3.6 x 3.3 x 4.4 cm = volume: 28 mL. Complex cyst with
mostly homogeneous internal echoes, this measures 3 x 1.7 x 2.6 cm,
previously 5.5 x 4.1 x 3.5 cm on MRI and [DATE] x 3.9 x 4.5 cm on
ultrasound.

Left ovary

Measurements: 2.8 x 1.6 x 2.3 cm = volume: 5.2 mL. Normal
appearance/no adnexal mass.

Other findings

No abnormal free fluid.
IMPRESSION: 1. Further reduction in size of complex right ovarian cyst,
previously thought to represent potential hemorrhagic cyst on MRI.
As this has not completely resolved, recommend 1 year ultrasound
follow-up.
2. Otherwise negative pelvic ultrasound

## 2020-12-02 ENCOUNTER — Other Ambulatory Visit: Payer: Self-pay | Admitting: Physician Assistant

## 2020-12-02 DIAGNOSIS — E559 Vitamin D deficiency, unspecified: Secondary | ICD-10-CM

## 2020-12-06 ENCOUNTER — Encounter: Payer: Self-pay | Admitting: Physician Assistant

## 2020-12-07 NOTE — Telephone Encounter (Signed)
Started a PA for Zovia medication for her dysmenorrhea, ovarian cyst, pelvic pain. Per GYN visit patient symptoms are improving with Zovia OCP. For better management of symptoms is to take medication continuation. Waiting on response from insurance company

## 2021-06-17 ENCOUNTER — Encounter: Payer: Self-pay | Admitting: Registered Nurse

## 2021-06-17 ENCOUNTER — Other Ambulatory Visit: Payer: Self-pay

## 2021-06-17 ENCOUNTER — Telehealth: Payer: Self-pay

## 2021-06-17 MED ORDER — ETHYNODIOL DIAC-ETH ESTRADIOL 1-35 MG-MCG PO TABS: 1.0000 | ORAL_TABLET | Freq: Every day | ORAL | 3 refills | Status: DC

## 2021-06-17 NOTE — Telephone Encounter (Signed)
Medication sent to pharmacy  

## 2021-06-17 NOTE — Telephone Encounter (Signed)
Pt has called back to schedule TOC in Nov

## 2021-06-17 NOTE — Telephone Encounter (Signed)
Pt needs refill on ethynodiol-ethinyl estradiol (ZOVIA) 1-35 MG-MCG tablet   [facility] [id number] - [place],  - [address] AT [place]   Last seen 11/21 No future appt set  Pt call back [phone]

## 2021-06-27 ENCOUNTER — Encounter: Payer: Self-pay | Admitting: Registered Nurse

## 2021-06-30 ENCOUNTER — Ambulatory Visit: Payer: No Typology Code available for payment source | Admitting: Obstetrics and Gynecology

## 2021-06-30 ENCOUNTER — Encounter: Payer: Self-pay | Admitting: Obstetrics and Gynecology

## 2021-06-30 ENCOUNTER — Other Ambulatory Visit: Payer: Self-pay

## 2021-06-30 VITALS — BP 110/70 | HR 88 | Resp 16 | Wt 164.0 lb

## 2021-06-30 DIAGNOSIS — B9689 Other specified bacterial agents as the cause of diseases classified elsewhere: Secondary | ICD-10-CM

## 2021-06-30 DIAGNOSIS — N912 Amenorrhea, unspecified: Secondary | ICD-10-CM

## 2021-06-30 DIAGNOSIS — Z113 Encounter for screening for infections with a predominantly sexual mode of transmission: Secondary | ICD-10-CM | POA: Diagnosis not present

## 2021-06-30 DIAGNOSIS — Z3009 Encounter for other general counseling and advice on contraception: Secondary | ICD-10-CM

## 2021-06-30 DIAGNOSIS — N898 Other specified noninflammatory disorders of vagina: Secondary | ICD-10-CM | POA: Diagnosis not present

## 2021-06-30 DIAGNOSIS — N760 Acute vaginitis: Secondary | ICD-10-CM

## 2021-06-30 LAB — PREGNANCY, URINE: Preg Test, Ur: NEGATIVE

## 2021-06-30 LAB — WET PREP FOR TRICH, YEAST, CLUE

## 2021-06-30 MED ORDER — METRONIDAZOLE 500 MG PO TABS: 500.0000 mg | ORAL_TABLET | Freq: Two times a day (BID) | ORAL | 0 refills | Status: DC

## 2021-06-30 MED ORDER — ETHYNODIOL DIAC-ETH ESTRADIOL 1-35 MG-MCG PO TABS: 1.0000 | ORAL_TABLET | Freq: Every day | ORAL | 3 refills | Status: DC

## 2021-06-30 NOTE — Patient Instructions (Signed)
Bacterial Vaginosis °Bacterial vaginosis is an infection that occurs when the normal balance of bacteria in the vagina changes. This change is caused by an overgrowth of certain bacteria in the vagina. Bacterial vaginosis is the most common vaginal infection among females aged 30 to 44 years. °This condition increases the risk of sexually transmitted infections (STIs). Treatment can help reduce this risk. Treatment is very important for pregnant women because this condition can cause babies to be born early (prematurely) or at a low birth weight. °What are the causes? °This condition is caused by an increase in harmful bacteria that are normally present in small amounts in the vagina. However, the exact reason this condition develops is not known. °You cannot get bacterial vaginosis from toilet seats, bedding, swimming pools, or contact with objects around you. °What increases the risk? °The following factors may make you more likely to develop this condition: °Having a new sexual partner or multiple sexual partners, or having unprotected sex. °Douching. °Having an intrauterine device (IUD). °Smoking. °Abusing drugs and alcohol. This may lead to riskier sexual behavior. °Taking certain antibiotic medicines. °Being pregnant. °What are the signs or symptoms? °Some women with this condition have no symptoms. Symptoms may include: °Gray or white vaginal discharge. The discharge can be watery or foamy. °A fish-like odor with discharge, especially after sex or during menstruation. °Itching in and around the vagina. °Burning or pain with urination. °How is this diagnosed? °This condition is diagnosed based on: °Your medical history. °A physical exam of the vagina. °Checking a sample of vaginal fluid for harmful bacteria or abnormal cells. °How is this treated? °This condition is treated with antibiotic medicines. These may be given as a pill, a vaginal cream, or a medicine that is put into the vagina (suppository). If the  condition comes back after treatment, a second round of antibiotics may be needed. °Follow these instructions at home: °Medicines °Take or apply over-the-counter and prescription medicines only as told by your health care provider. °Take or apply your antibiotic medicine as told by your health care provider. Do not stop using the antibiotic even if you start to feel better. °General instructions °If you have a female sexual partner, tell her that you have a vaginal infection. She should follow up with her health care provider. If you have a female sexual partner, he does not need treatment. °Avoid sexual activity until you finish treatment. °Drink enough fluid to keep your urine pale yellow. °Keep the area around your vagina and rectum clean. °Wash the area daily with warm water. °Wipe yourself from front to back after using the toilet. °If you are breastfeeding, talk to your health care provider about continuing breastfeeding during treatment. °Keep all follow-up visits. This is important. °How is this prevented? °Self-care °Do not douche. °Wash the outside of your vagina with warm water only. °Wear cotton or cotton-lined underwear. °Avoid wearing tight pants and pantyhose, especially during the summer. °Safe sex °Use protection when having sex. This includes: °Using condoms. °Using dental dams. This is a thin layer of a material made of latex or polyurethane that protects the mouth during oral sex. °Limit the number of sexual partners. To help prevent bacterial vaginosis, it is best to have sex with just one partner (monogamous relationship). °Make sure you and your sexual partner are tested for STIs. °Drugs and alcohol °Do not use any products that contain nicotine or tobacco. These products include cigarettes, chewing tobacco, and vaping devices, such as e-cigarettes. If you need help quitting,   ask your health care provider. °Do not use drugs. °Do not drink alcohol if: °Your health care provider tells you not to  do this. °You are pregnant, may be pregnant, or are planning to become pregnant. °If you drink alcohol: °Limit how much you have to 0-1 drink a day. °Be aware of how much alcohol is in your drink. In the [place] one drink equals one 12 oz bottle of beer (355 mL), one 5 oz glass of wine (148 mL), or one 1½ oz glass of hard liquor (44 mL). °Where to find more information °Centers for Disease Control and Prevention: www.cdc.gov °American Sexual Health Association (ASHA): www.ashastd.org °[place] Department of Health and Human Services, Office on Women's Health: www.womenshealth.gov °Contact a health care provider if: °Your symptoms do not improve, even after treatment. °You have more discharge or pain when urinating. °You have a fever or chills. °You have pain in your abdomen or pelvis. °You have pain during sex. °You have vaginal bleeding between menstrual periods. °Summary °Bacterial vaginosis is a vaginal infection that occurs when the normal balance of bacteria in the vagina changes. It results from an overgrowth of certain bacteria. °This condition increases the risk of sexually transmitted infections (STIs). Getting treated can help reduce this risk. °Treatment is very important for pregnant women because this condition can cause babies to be born early (prematurely) or at low birth weight. °This condition is treated with antibiotic medicines. These may be given as a pill, a vaginal cream, or a medicine that is put into the vagina (suppository). °This information is not intended to replace advice given to you by your health care provider. Make sure you discuss any questions you have with your health care provider. °Document Revised: 04/15/2020 Document Reviewed: 04/15/2020 °Elsevier Patient Education © 2022 Elsevier Inc. ° °

## 2021-06-30 NOTE — Progress Notes (Signed)
GYNECOLOGY  VISIT   HPI: 30 y.o.   Single White or Caucasian Not Hispanic or Latino  female   G0P0000 with Patient's last menstrual period was 05/18/2021 (exact date).   here for  missed menstrual cycle She missed a couple of pills at the end of July/beginning of August. Otherwise taking them on time. She was supposed to start her new pack on Monday, but lost it.  She started on OCP's since last fall for pelvic pain and an ovarian cyst. Her pelvic pain is better, still getting occasional pain but it is better. Last night and this morning she has had a headache.  Prior to starting OCP's her cycles were regular.   Sexually active, same partner x 2 years. Not using condoms.  She was having regular cycles on OCP's, getting lighter over time.   She would like STD testing.  GYNECOLOGIC HISTORY: Patient's last menstrual period was 05/18/2021 (exact date). Contraception: no ocp since monday Menopausal hormone therapy: none        OB History     Gravida  0   Para  0   Term  0   Preterm  0   AB  0   Living  0      SAB  0   IAB  0   Ectopic  0   Multiple  0   Live Births  0              There are no problems to display for this patient.   Past Medical History:  Diagnosis Date   Allergy    Anemia    Anxiety    Vitamin D deficiency     Past Surgical History:  Procedure Laterality Date   NO PAST SURGERIES      Current Outpatient Medications  Medication Sig Dispense Refill   Multiple Vitamin (MULTIVITAMIN PO) Take by mouth.     ethynodiol-ethinyl estradiol (ZOVIA) 1-35 MG-MCG tablet Take 1 tablet by mouth daily. (Patient not taking: Reported on 06/30/2021) 28 tablet 3   Mefenamic Acid 250 MG CAPS Take 1 capsule (250 mg total) by mouth 4 (four) times daily as needed. (Patient not taking: Reported on 06/30/2021) 28 capsule 0   No current facility-administered medications for this visit.     ALLERGIES: Fluconazole  Family History  Problem Relation Age of  Onset   Rheum arthritis Father    Cancer Maternal Grandfather        Lung   Cancer Paternal Grandfather        Prostate    Social History   Socioeconomic History   Marital status: Single    Spouse name: Not on file   Number of children: Not on file   Years of education: Not on file   Highest education level: Not on file  Occupational History   Not on file  Tobacco Use   Smoking status: Never   Smokeless tobacco: Never  Vaping Use   Vaping Use: Never used  Substance and Sexual Activity   Alcohol use: Not Currently   Drug use: No   Sexual activity: Yes    Birth control/protection: Pill    Comment: no pill since monday & not sexually active since monday  Other Topics Concern   Not on file  Social History Narrative   Not on file   Social Determinants of Health   Financial Resource Strain: Not on file  Food Insecurity: Not on file  Transportation Needs: Not on file  Physical Activity:  Not on file  Stress: Not on file  Social Connections: Not on file  Intimate Partner Violence: Not on file    Review of Systems  Constitutional: Negative.   HENT: Negative.    Eyes: Negative.   Respiratory: Negative.    Cardiovascular: Negative.   Gastrointestinal: Negative.   Genitourinary: Negative.   Musculoskeletal: Negative.   Skin: Negative.   Neurological: Negative.   Endo/Heme/Allergies: Negative.   Psychiatric/Behavioral: Negative.     PHYSICAL EXAMINATION:    BP 110/70   Pulse 88   Resp 16   Wt 164 lb (74.4 kg)   LMP 05/18/2021 (Exact Date)   BMI 27.29 kg/m     General appearance: alert, cooperative and appears stated age  Pelvic: External genitalia:  no lesions              Urethra:  normal appearing urethra with no masses, tenderness or lesions              Bartholins and Skenes: normal                 Vagina: normal appearing vagina with an increase in white, frothy, watery vaginal discharge (on questioning, the patient has noticed an increase in vaginal  d/c)              Cervix: no lesions               Chaperone was present for exam.  1. Amenorrhea On OCP's, few skipped pills - Pregnancy, urine: negative -Patient reassured   2. General counseling and advice on female contraception She lost her pill pack, was due to start 3 days ago. Will restart now and use back up contraception for one week.  - ethynodiol-ethinyl estradiol (ZOVIA) 1-35 MG-MCG tablet; Take 1 tablet by mouth daily.  Dispense: 84 tablet; Refill: 3  3. Screening examination for STD (sexually transmitted disease) - RPR - HIV Antibody (routine testing w rflx) - Hepatitis C antibody - HSV(herpes simplex vrs) 1+2 ab-IgG - SURESWAB CT/NG/T. vaginalis  4. Vaginal discharge - WET PREP FOR [place], YEAST, [place]

## 2021-07-01 LAB — HEPATITIS C ANTIBODY
Hepatitis C Ab: NONREACTIVE
SIGNAL TO CUT-OFF: 0.01 (ref <1.00)

## 2021-07-01 LAB — HIV ANTIBODY (ROUTINE TESTING W REFLEX): HIV 1&2 Ab, 4th Generation: NONREACTIVE

## 2021-07-01 LAB — SURESWAB CT/NG/T. VAGINALIS
C. trachomatis RNA, TMA: NOT DETECTED
N. gonorrhoeae RNA, TMA: NOT DETECTED
Trichomonas vaginalis RNA: NOT DETECTED

## 2021-07-01 LAB — HSV(HERPES SIMPLEX VRS) I + II AB-IGG
HAV 1 IGG,TYPE SPECIFIC AB: <0.9 index
HSV 2 IGG,TYPE SPECIFIC AB: <0.9 index

## 2021-07-01 LAB — RPR: RPR Ser Ql: NONREACTIVE

## 2021-07-05 ENCOUNTER — Encounter: Payer: Self-pay | Admitting: Obstetrics and Gynecology

## 2021-07-18 ENCOUNTER — Encounter: Payer: Self-pay | Admitting: Obstetrics and Gynecology

## 2021-07-19 ENCOUNTER — Encounter: Payer: Self-pay | Admitting: Registered Nurse

## 2021-08-21 NOTE — Telephone Encounter (Signed)
This concern has been previously addressed by myself and/or another provider.  If they patient has ongoing concerns, they can contact me at their convenience.  Thank you,  Rich Faith Branan, NP 

## 2021-09-08 ENCOUNTER — Ambulatory Visit (INDEPENDENT_AMBULATORY_CARE_PROVIDER_SITE_OTHER): Payer: No Typology Code available for payment source | Admitting: Registered Nurse

## 2021-09-08 ENCOUNTER — Other Ambulatory Visit: Payer: Self-pay

## 2021-09-08 ENCOUNTER — Encounter: Payer: Self-pay | Admitting: Registered Nurse

## 2021-09-08 VITALS — BP 112/58 | HR 80 | Temp 98.3°F | Resp 18 | Ht 65.0 in | Wt 168.2 lb

## 2021-09-08 DIAGNOSIS — E559 Vitamin D deficiency, unspecified: Secondary | ICD-10-CM | POA: Diagnosis not present

## 2021-09-08 DIAGNOSIS — N83201 Unspecified ovarian cyst, right side: Secondary | ICD-10-CM

## 2021-09-08 DIAGNOSIS — Z3009 Encounter for other general counseling and advice on contraception: Secondary | ICD-10-CM

## 2021-09-08 DIAGNOSIS — R5382 Chronic fatigue, unspecified: Secondary | ICD-10-CM | POA: Diagnosis not present

## 2021-09-08 DIAGNOSIS — G479 Sleep disorder, unspecified: Secondary | ICD-10-CM

## 2021-09-08 LAB — CBC WITH DIFFERENTIAL/PLATELET
Basophils Absolute: 0 10*3/uL (ref 0.0–0.1)
Basophils Relative: 0.6 % (ref 0.0–3.0)
Eosinophils Absolute: 0.1 10*3/uL (ref 0.0–0.7)
Eosinophils Relative: 0.6 % (ref 0.0–5.0)
HCT: 38.6 % (ref 36.0–46.0)
Hemoglobin: 12.7 g/dL (ref 12.0–15.0)
Lymphocytes Relative: 27.1 % (ref 12.0–46.0)
Lymphs Abs: 2.3 10*3/uL (ref 0.7–4.0)
MCHC: 32.9 g/dL (ref 30.0–36.0)
MCV: 90 fl (ref 78.0–100.0)
Monocytes Absolute: 0.6 10*3/uL (ref 0.1–1.0)
Monocytes Relative: 7.5 % (ref 3.0–12.0)
Neutro Abs: 5.3 10*3/uL (ref 1.4–7.7)
Neutrophils Relative %: 64.2 % (ref 43.0–77.0)
Platelets: 314 10*3/uL (ref 150.0–400.0)
RBC: 4.29 Mil/uL (ref 3.87–5.11)
RDW: 12.5 % (ref 11.5–15.5)
WBC: 8.3 10*3/uL (ref 4.0–10.5)

## 2021-09-08 LAB — COMPREHENSIVE METABOLIC PANEL
ALT: 10 U/L (ref 0–35)
AST: 14 U/L (ref 0–37)
Albumin: 4.4 g/dL (ref 3.5–5.2)
Alkaline Phosphatase: 37 U/L — ABNORMAL LOW (ref 39–117)
BUN: 13 mg/dL (ref 6–23)
CO2: 28 mEq/L (ref 19–32)
Calcium: 9.3 mg/dL (ref 8.4–10.5)
Chloride: 101 mEq/L (ref 96–112)
Creatinine, Ser: 0.54 mg/dL (ref 0.40–1.20)
GFR: 123.2 mL/min (ref >60.00)
Glucose, Bld: 69 mg/dL — ABNORMAL LOW (ref 70–99)
Potassium: 4.3 mEq/L (ref 3.5–5.1)
Sodium: 136 mEq/L (ref 135–145)
Total Bilirubin: 0.4 mg/dL (ref 0.2–1.2)
Total Protein: 7.6 g/dL (ref 6.0–8.3)

## 2021-09-08 LAB — LIPID PANEL
Cholesterol: 200 mg/dL (ref 0–200)
HDL: 52.2 mg/dL (ref >39.00)
LDL Cholesterol: 116 mg/dL — ABNORMAL HIGH (ref 0–99)
NonHDL: 148.24
Total CHOL/HDL Ratio: 4
Triglycerides: 159 mg/dL — ABNORMAL HIGH (ref 0.0–149.0)
VLDL: 31.8 mg/dL (ref 0.0–40.0)

## 2021-09-08 LAB — B12 AND FOLATE PANEL
Folate: >23.4 ng/mL (ref >5.9)
Vitamin B-12: 257 pg/mL (ref 211–911)

## 2021-09-08 LAB — VITAMIN D 25 HYDROXY (VIT D DEFICIENCY, FRACTURES): VITD: 44.26 ng/mL (ref 30.00–100.00)

## 2021-09-08 LAB — TSH: TSH: 1.81 u[IU]/mL (ref 0.35–5.50)

## 2021-09-08 LAB — HEMOGLOBIN A1C: Hgb A1c MFr Bld: 5.5 % (ref 4.6–6.5)

## 2021-09-08 MED ORDER — TRAZODONE HCL 50 MG PO TABS: 25.0000 mg | ORAL_TABLET | Freq: Every evening | ORAL | 3 refills | Status: DC | PRN

## 2021-09-08 NOTE — Progress Notes (Signed)
Established Patient Office Visit  Subjective:  Patient ID: Jill Stafford, female    DOB: 07-15-1991  Age: 30 y.o. MRN: [id number]  CC:  Chief Complaint  Patient presents with   Transitions Of Care    Patient states she is here for TOC. Patient has no concerns.    HPI Jill Stafford presents for TOC  No acute concerns Histories reviewed and updated with patient.   Interested in [place] today  HM: due for covid booster, will pursue at pharmacy Up to date on other immunizations and pap.  Sleep disturbance Easy to fall asleep, wakes frequently  Some anxiety at night No snoring. Otherwise doing ok.  Seeing counselor regularly, not on anxiolytics.  Fatigue Known hx of vitamin d deficiency, anemia, and fam hx of autoimmune processes. Would like to check labs before further work up  Notes she is working on improving diet and going to start exercising more.   Past Medical History:  Diagnosis Date   Allergy    Anemia    Anxiety    Vitamin D deficiency     Past Surgical History:  Procedure Laterality Date   ADENOIDECTOMY     NO PAST SURGERIES      Family History  Problem Relation Age of Onset   Rheum arthritis Father    Cancer Maternal Grandfather        Lung   Cancer Paternal Grandfather        Prostate   Rheum arthritis Maternal Aunt    Lupus Maternal Aunt     Social History   Socioeconomic History   Marital status: Single    Spouse name: Not on file   Number of children: Not on file   Years of education: Not on file   Highest education level: Not on file  Occupational History   Not on file  Tobacco Use   Smoking status: Never   Smokeless tobacco: Never  Vaping Use   Vaping Use: Never used  Substance and Sexual Activity   Alcohol use: Yes    Comment: rare   Drug use: No   Sexual activity: Yes    Birth control/protection: Pill    Comment: no pill since monday & not sexually active since monday  Other Topics Concern   Not on file  Social  History Narrative   Not on file   Social Determinants of Health   Financial Resource Strain: Not on file  Food Insecurity: Not on file  Transportation Needs: Not on file  Physical Activity: Not on file  Stress: Not on file  Social Connections: Not on file  Intimate Partner Violence: Not on file    Outpatient Medications Prior to Visit  Medication Sig Dispense Refill   ethynodiol-ethinyl estradiol (ZOVIA) 1-35 MG-MCG tablet Take 1 tablet by mouth daily. 84 tablet 3   Multiple Vitamin (MULTIVITAMIN PO) Take by mouth.     Mefenamic Acid 250 MG CAPS Take 1 capsule (250 mg total) by mouth 4 (four) times daily as needed. (Patient not taking: No sig reported) 28 capsule 0   metroNIDAZOLE (FLAGYL) 500 MG tablet Take 1 tablet (500 mg total) by mouth 2 (two) times daily. (Patient not taking: Reported on 09/08/2021) 14 tablet 0   No facility-administered medications prior to visit.    Allergies  Allergen Reactions   Fluconazole Rash    ROS Review of Systems  Constitutional: Negative.   HENT: Negative.    Eyes: Negative.   Respiratory: Negative.    Cardiovascular:  Negative.   Gastrointestinal: Negative.   Genitourinary: Negative.   Musculoskeletal: Negative.   Skin: Negative.   Neurological: Negative.   Psychiatric/Behavioral: Negative.    All other systems reviewed and are negative.    Objective:    Physical Exam Vitals and nursing note reviewed.  Constitutional:      General: She is not in acute distress.    Appearance: Normal appearance. She is normal weight. She is not ill-appearing, toxic-appearing or diaphoretic.  Cardiovascular:     Rate and Rhythm: Normal rate and regular rhythm.     Heart sounds: Normal heart sounds. No murmur heard.   No friction rub. No gallop.  Pulmonary:     Effort: Pulmonary effort is normal. No respiratory distress.     Breath sounds: Normal breath sounds. No stridor. No wheezing, rhonchi or rales.  Chest:     Chest wall: No tenderness.   Skin:    General: Skin is warm and dry.  Neurological:     General: No focal deficit present.     Mental Status: She is alert and oriented to person, place, and time. Mental status is at baseline.  Psychiatric:        Mood and Affect: Mood normal.        Behavior: Behavior normal.        Thought Content: Thought content normal.        Judgment: Judgment normal.    BP (!) 112/58   Pulse 80   Temp 98.3 F (36.8 C) (Temporal)   Resp 18   Ht 5\' 5"  (1.651 m)   Wt 168 lb 3.2 oz (76.3 kg)   SpO2 100%   BMI 27.99 kg/m  Wt Readings from Last 3 Encounters:  09/08/21 168 lb 3.2 oz (76.3 kg)  06/30/21 164 lb (74.4 kg)  10/07/20 157 lb (71.2 kg)     Health Maintenance Due  Topic Date Due   COVID-19 Vaccine (3 - Booster for Pfizer series) 09/01/2020    There are no preventive care reminders to display for this patient.  Lab Results  Component Value Date   TSH 1.19 09/02/2020   Lab Results  Component Value Date   WBC 7.8 09/02/2020   HGB 11.9 (L) 09/02/2020   HCT 35.4 (L) 09/02/2020   MCV 88.1 09/02/2020   PLT 298.0 09/02/2020   Lab Results  Component Value Date   NA 137 09/02/2020   K 4.0 09/02/2020   CO2 27 09/02/2020   GLUCOSE 73 09/02/2020   BUN 8 09/02/2020   CREATININE 0.53 09/02/2020   BILITOT 0.4 09/02/2020   ALKPHOS 32 (L) 09/02/2020   AST 13 09/02/2020   ALT 16 09/02/2020   PROT 6.7 09/02/2020   ALBUMIN 4.0 09/02/2020   CALCIUM 8.9 09/02/2020   GFR 124.64 09/02/2020   Lab Results  Component Value Date   CHOL 175 09/02/2020   Lab Results  Component Value Date   HDL 46.80 09/02/2020   Lab Results  Component Value Date   LDLCALC 107 (H) 09/02/2020   Lab Results  Component Value Date   TRIG 108.0 09/02/2020   Lab Results  Component Value Date   CHOLHDL 4 09/02/2020   Lab Results  Component Value Date   HGBA1C 5.3 09/02/2020      Assessment & Plan:   Problem List Items Addressed This Visit   None Visit Diagnoses     Sleep  disturbance    -  Primary   Relevant Medications   traZODone (DESYREL)  50 MG tablet   Other Relevant Orders   CBC with Differential/Platelet   Hemoglobin A1c   TSH   Lipid panel   Vitamin D deficiency       Relevant Orders   CBC with Differential/Platelet   Hemoglobin A1c   TSH   Lipid panel   Vitamin D (25 hydroxy)   Counseling for birth control, oral contraceptives       Relevant Orders   CBC with Differential/Platelet   Hemoglobin A1c   TSH   Lipid panel   Cyst of right ovary       Relevant Orders   US Pelvic Complete With Transvaginal   CBC with Differential/Platelet   Hemoglobin A1c   TSH   Lipid panel   Chronic fatigue       Relevant Orders   CBC with Differential/Platelet   Hemoglobin A1c   TSH   Lipid panel   Comprehensive metabolic panel   Iron, TIBC and Ferritin Panel   Vitamin D (25 hydroxy)   B12 and Folate Panel       Meds ordered this encounter  Medications   traZODone (DESYREL) 50 MG tablet    Sig: Take 0.5-1 tablets (25-50 mg total) by mouth at bedtime as needed for sleep.    Dispense:  30 tablet    Refill:  3    Order Specific Question:   Supervising Provider    Answer:   Carlota Raspberry, JEFFREY R [[id number]    Follow-up: Return in about 1 year (around 09/08/2022) for CPE and labs.   PLAN Labs as above. Will follow up as warranted. Start trazodone 25-50mg  po qhs prn . Continue counseling Return for physical and labs in 2023 Patient encouraged to call clinic with any questions, comments, or concerns.  Maximiano Coss, NP

## 2021-09-08 NOTE — Patient Instructions (Addendum)
Ms. Jill Stafford to meet you!  Try out the trazodone. Let me know if it's not the right fit.  I'll let you know how labs look.  Pop in in 2023 for physical and labs!  Thank you  Rich     If you have lab work done today you will be contacted with your lab results within the next 2 weeks.  If you have not heard from [place] then please contact us. The fastest way to get your results is to register for My Chart.   IF you received an x-ray today, you will receive an invoice from [facility]. Please contact [facility] at [phone] with questions or concerns regarding your invoice.   IF you received labwork today, you will receive an invoice from [facility]. Please contact [facility] at [phone] with questions or concerns regarding your invoice.   Our billing staff will not be able to assist you with questions regarding bills from these companies.  You will be contacted with the lab results as soon as they are available. The fastest way to get your results is to activate your My Chart account. Instructions are located on the last page of this paperwork. If you have not heard from [place] regarding the results in 2 weeks, please contact this office.

## 2021-09-09 ENCOUNTER — Encounter: Payer: Self-pay | Admitting: Registered Nurse

## 2021-09-09 LAB — IRON,TIBC AND FERRITIN PANEL
%SAT: 35 % (calc) (ref 16–45)
Ferritin: 24 ng/mL (ref 16–154)
Iron: 159 ug/dL (ref 40–190)
TIBC: 455 mcg/dL (calc) — ABNORMAL HIGH (ref 250–450)

## 2021-09-29 ENCOUNTER — Other Ambulatory Visit: Payer: Self-pay | Admitting: Registered Nurse

## 2021-09-29 ENCOUNTER — Encounter: Payer: Self-pay | Admitting: Registered Nurse

## 2021-09-29 ENCOUNTER — Ambulatory Visit
Admission: RE | Admit: 2021-09-29 | Discharge: 2021-09-29 | Disposition: A | Discharge status: Home / Self Care | Payer: No Typology Code available for payment source | Source: Ambulatory Visit | Attending: Registered Nurse | Admitting: Registered Nurse

## 2021-09-29 DIAGNOSIS — N83201 Unspecified ovarian cyst, right side: Secondary | ICD-10-CM

## 2021-09-29 DIAGNOSIS — N83209 Unspecified ovarian cyst, unspecified side: Secondary | ICD-10-CM

## 2021-09-29 IMAGING — US US PELVIS COMPLETE WITH TRANSVAGINAL
1 series · 13 of 25 positions shown · non-contrast
Comparison: [DATE]

CLINICAL DATA: Fall follow-up RIGHT ovarian cyst, LMP [DATE]

EXAM:
TRANSABDOMINAL AND TRANSVAGINAL ULTRASOUND OF PELVIS
TECHNIQUE: Both transabdominal and transvaginal ultrasound examinations of the
pelvis were performed. Transabdominal technique was performed for
global imaging of the pelvis including uterus, ovaries, adnexal
regions, and pelvic cul-de-sac. It was necessary to proceed with
endovaginal exam following the transabdominal exam to visualize the
endometrium and ovaries.

[Series 1: us pelvis complete with transvaginal · 0.17mm/px · 85 acquisitions, 13 frames shown]
[im 1/85]
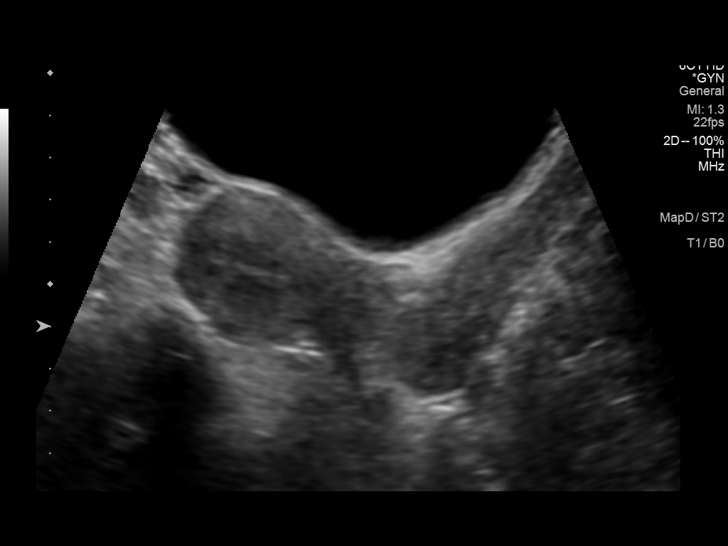
[im 8/85]
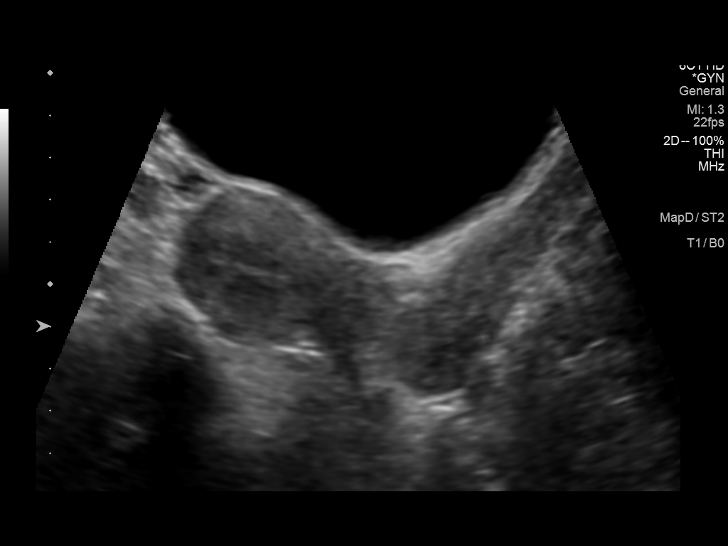
[im 15/85]
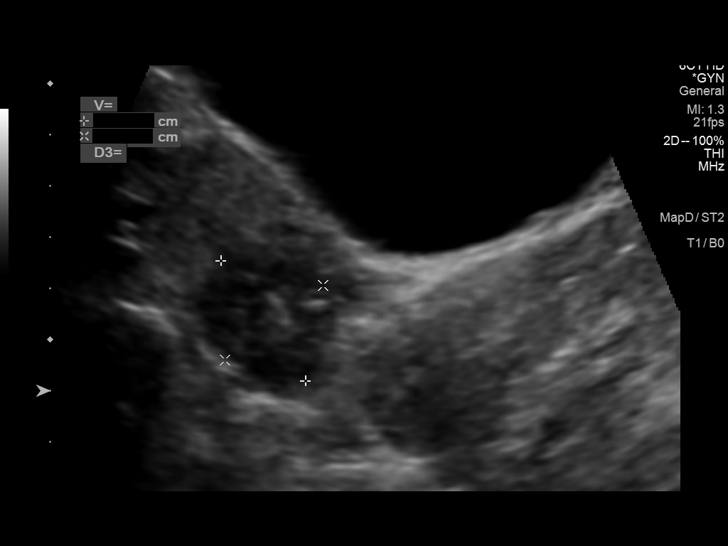
[im 22/85]
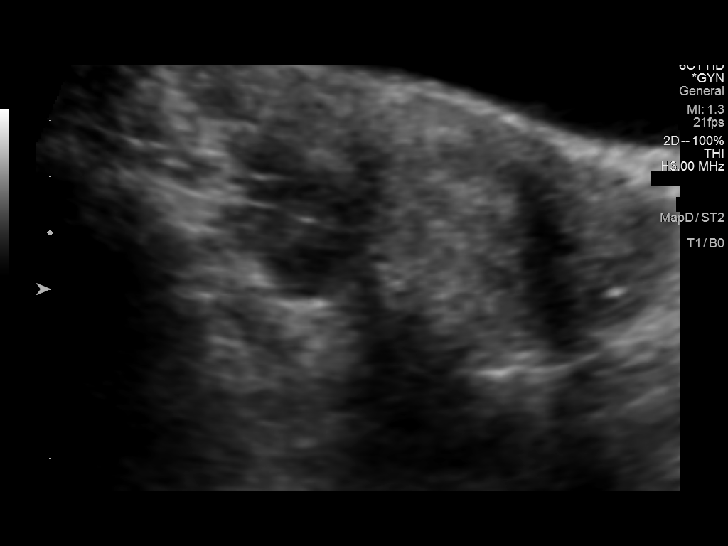
[im 29/85]
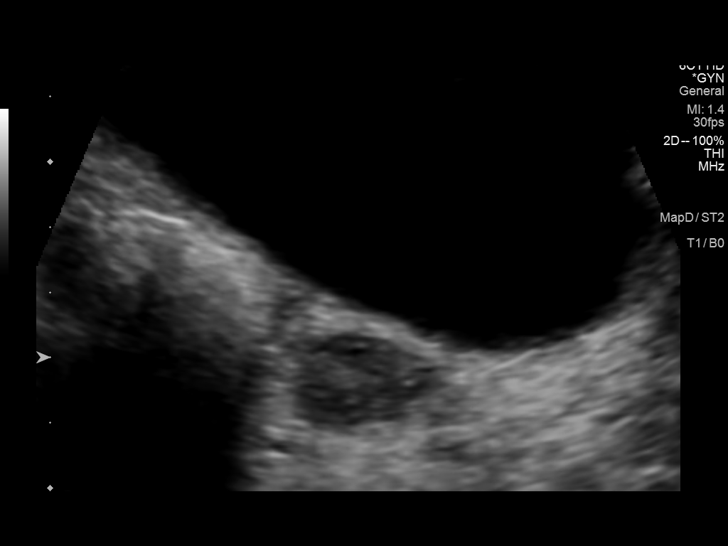
[im 36/85]
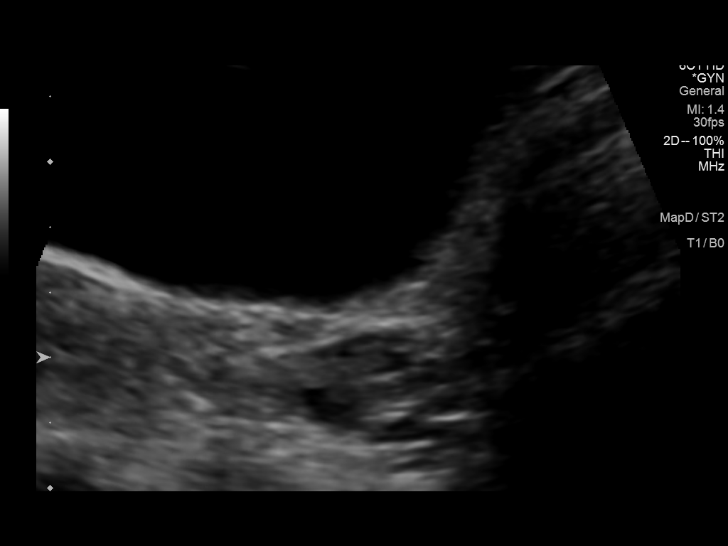
[im 43/85]
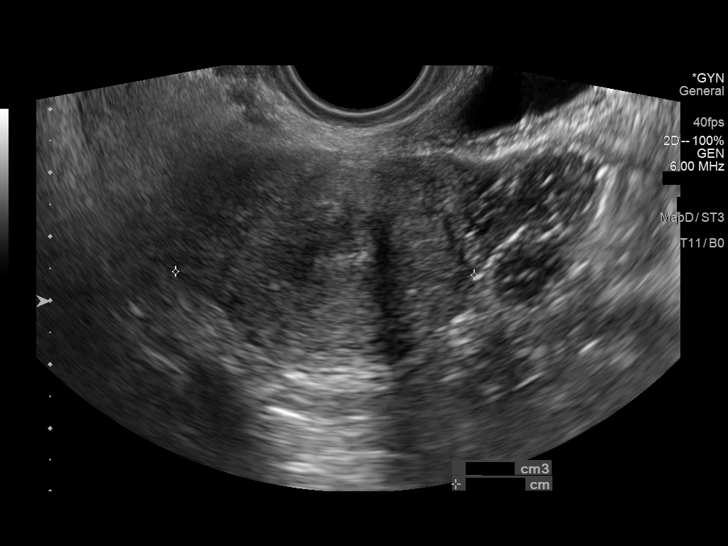
[im 50/85]
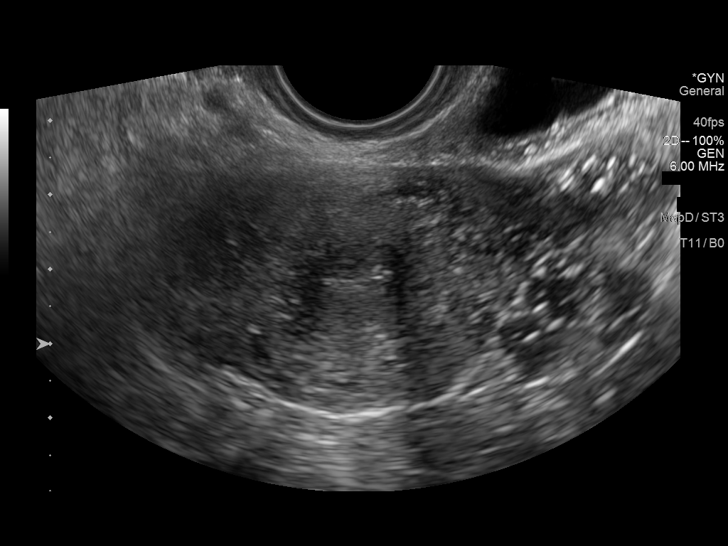
[im 57/85]
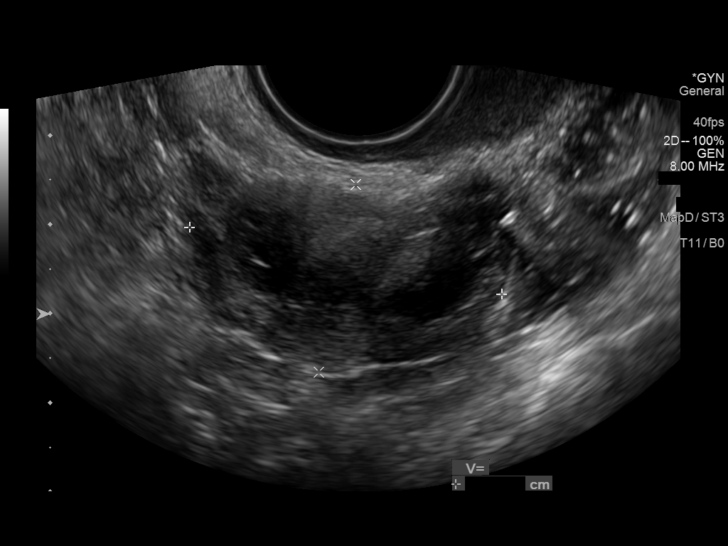
[im 64/85]
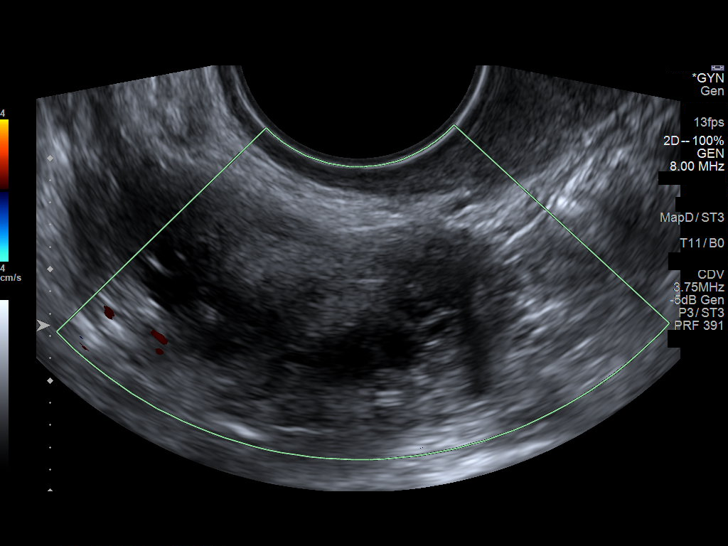
[im 71/85]
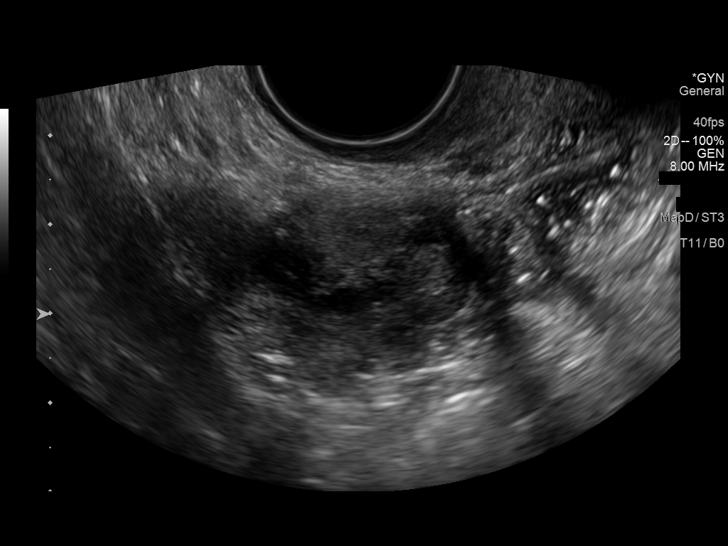
[im 78/85]
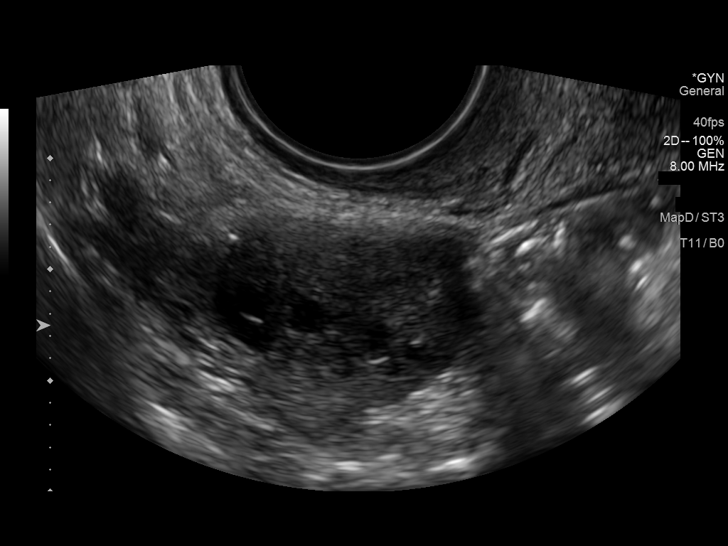
[im 85/85]
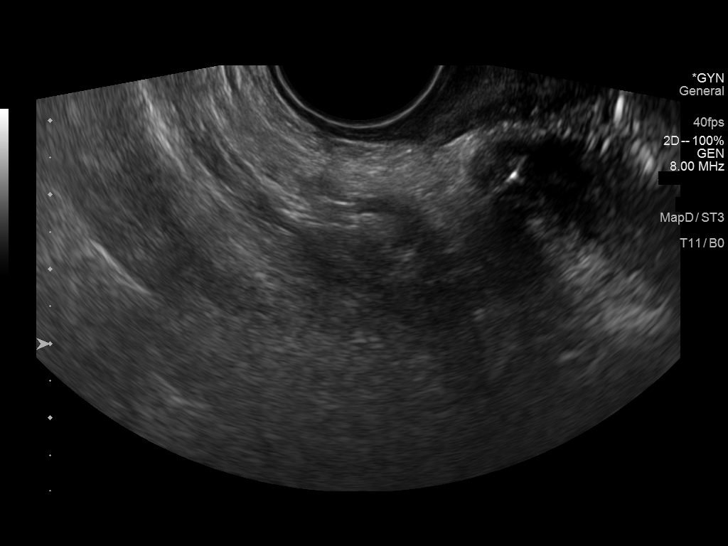

[13 of 25 positions shown; findings below may reference images not displayed]

FINDINGS: Uterus

Measurements: 7.5 x 3.5 x 4.8 cm = volume: 66 mL. Anteverted.
Heterogeneous myometrium. No focal mass.

Endometrium

Thickness: 4 mm.  No endometrial fluid or mass

Right ovary

Measurements: 3.6 x 2.1 x 2.1 cm = volume: 8.3 mL. Mildly
hyperechoic slightly heterogeneous nodule within RIGHT ovary 14 x 12
x 12 mm, indeterminate, slightly increased since previous exam. This
lesion was previously favored to be a hemorrhagic cyst by MR, though
the hyperechogenicity and increase in size are not consistent with
hemorrhagic cyst evolution.

Left ovary

Measurements: 2.3 x 1.5 x 1.5 cm = volume: 2.6 mL. Normal morphology
without mass

Other findings

No free pelvic fluid.  No additional adnexal masses.
IMPRESSION: Unremarkable uterus, endometrial complex and LEFT ovary.

Increase in size of a hyperechoic heterogeneous nodule within RIGHT
ovary, inconsistent with evolution of a hemorrhagic cyst; repeat
assessment by MR imaging with and without contrast recommended to
characterize.

## 2021-09-30 NOTE — Telephone Encounter (Signed)
Pt asking about interpretation of result please advise about possible mass

## 2021-10-16 ENCOUNTER — Other Ambulatory Visit: Payer: Self-pay | Admitting: Family

## 2021-10-16 DIAGNOSIS — Z3009 Encounter for other general counseling and advice on contraception: Secondary | ICD-10-CM

## 2021-10-17 ENCOUNTER — Encounter: Payer: Self-pay | Admitting: Registered Nurse

## 2021-10-17 ENCOUNTER — Encounter: Payer: Self-pay | Admitting: Obstetrics and Gynecology

## 2021-10-24 ENCOUNTER — Ambulatory Visit
Admission: RE | Admit: 2021-10-24 | Discharge: 2021-10-24 | Disposition: A | Discharge status: Home / Self Care | Payer: No Typology Code available for payment source | Source: Ambulatory Visit | Attending: Registered Nurse | Admitting: Registered Nurse

## 2021-10-24 DIAGNOSIS — N83209 Unspecified ovarian cyst, unspecified side: Secondary | ICD-10-CM

## 2021-10-24 IMAGING — MR MR PELVIS WO/W CM
21 of 22 series · 45 of 48 positions shown · IV contrast (15ml Multihance)
Comparison: Ultrasound exam [DATE].  Pelvic MRI [DATE]

CLINICAL DATA: 14 mm right ovarian lesion on ultrasound. MR
follow-up recommended.

EXAM:
MRI PELVIS WITHOUT AND WITH CONTRAST
TECHNIQUE: Multiplanar multisequence MR imaging of the pelvis was performed
both before and after administration of intravenous contrast.
CONTRAST:  15mL MULTIHANCE GADOBENATE DIMEGLUMINE 529 MG/ML IV SOLN

[Series 2: T2 · coronal · 5.0mm · 1.25mm/px · 1 of 29 slices shown (1 of 2)]
[im 1/29]
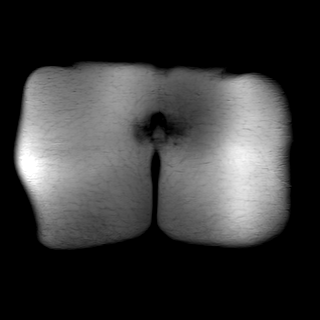

[Series 2: cor haste cover · coronal · 6.0mm · 0.74mm/px · 1 of 32 slices shown]
[im 1/32]
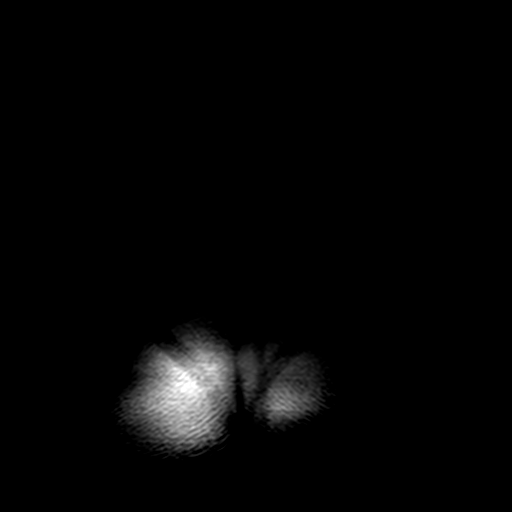

[Series 3: T2 · sagittal · 5.0mm · 0.78mm/px · 2 of 30 slices shown (2 of 2)]
[im 1/30]
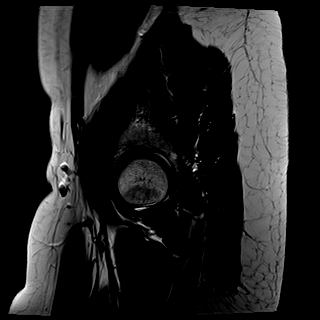
[im 30/30]
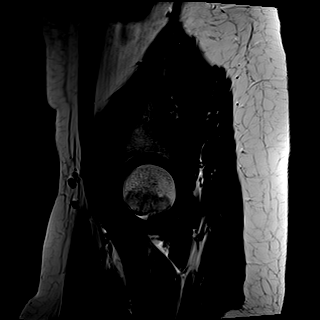

[Series 3: t2_tse_sag · sagittal · 5.0mm · 1.05mm/px · 2 of 32 slices shown]
[im 1/32]
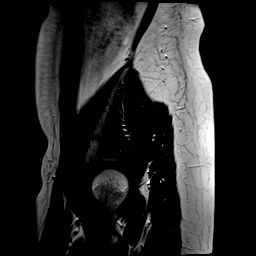
[im 32/32]
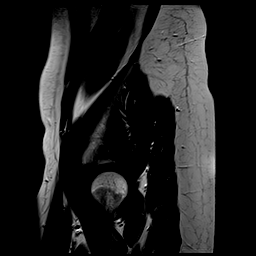

[Series 4: t2_tse axial · axial · 7.0mm · 0.51mm/px · z∈[-100,+118]mm · 2 of 25 slices shown]
[im 1/25]
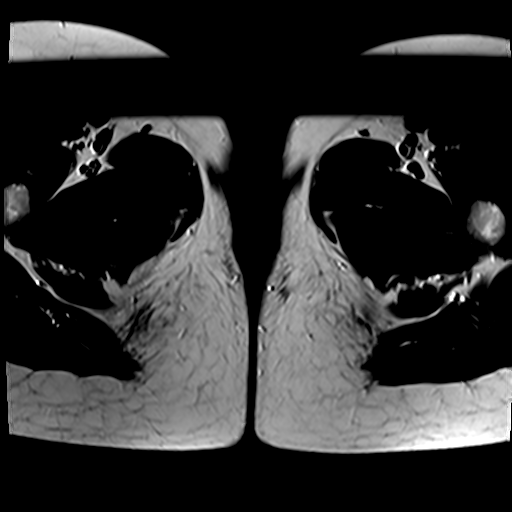
[im 25/25]
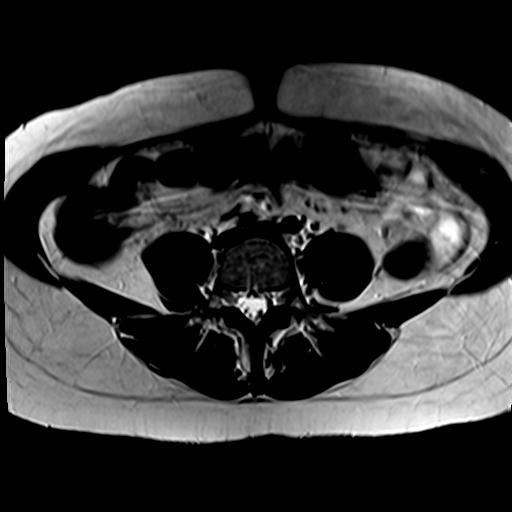

[Series 5: t2_tse axial fs · axial · 7.0mm · 0.51mm/px · z∈[-100,+118]mm · 2 of 25 slices shown]
[im 1/25]
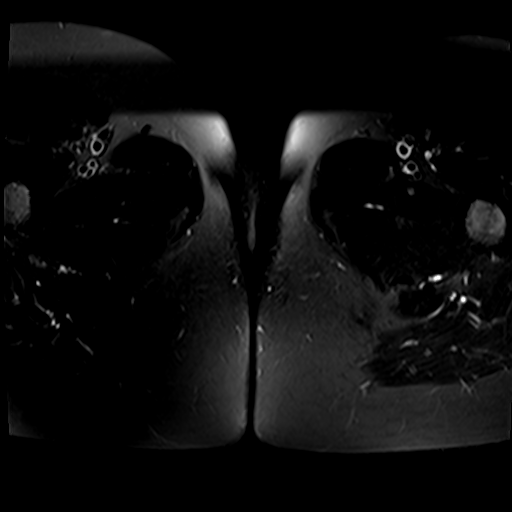
[im 25/25]
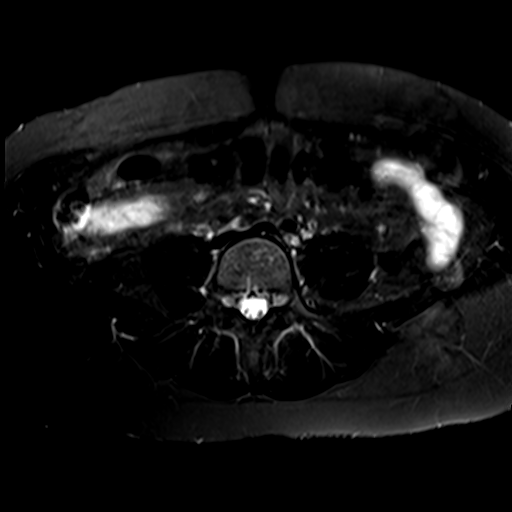

[Series 6: T1 · axial · 5.0mm · 0.55mm/px · z∈[-90,+108]mm · 6 of 74 slices shown]
[im 1/74]
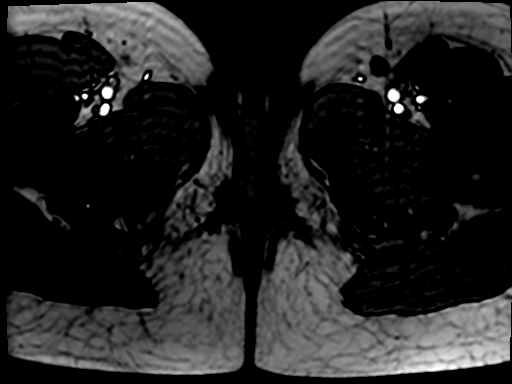
[im 15/74]
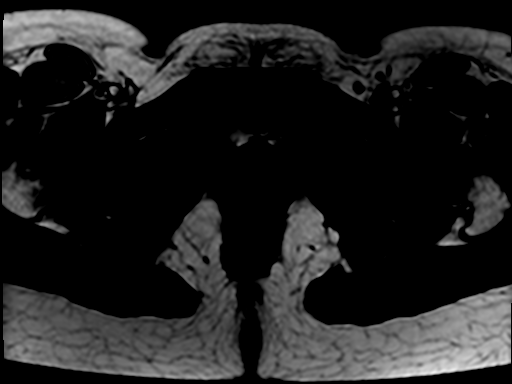
[im 30/74]
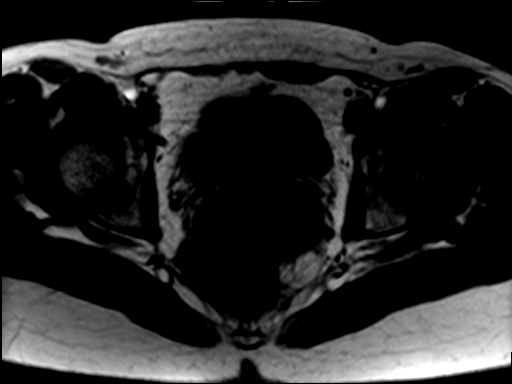
[im 44/74]
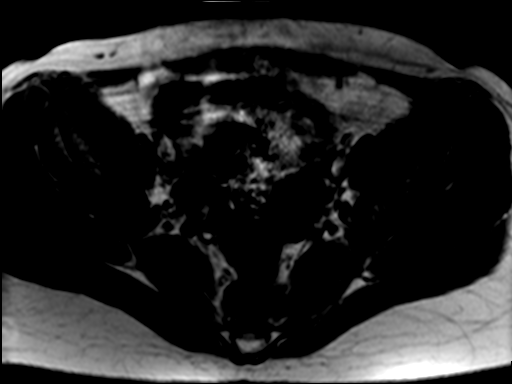
[im 59/74]
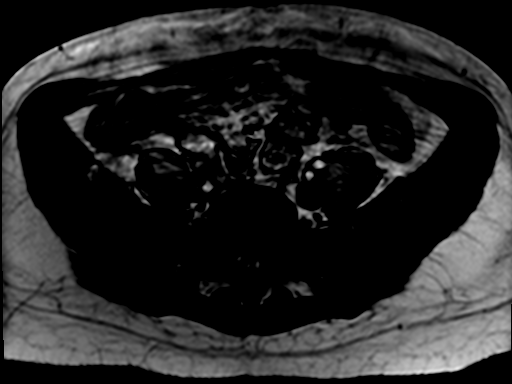
[im 74/74]
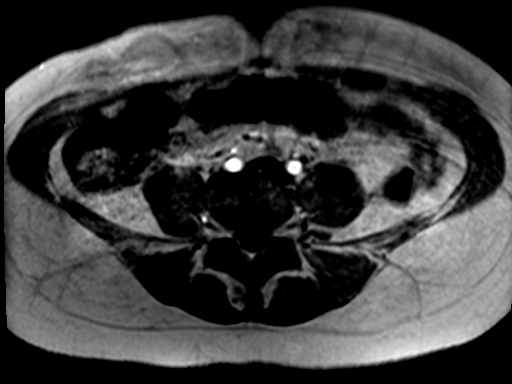

[Series 7: DWI · axial · 6.0mm · 1.82mm/px · z∈[-103,+121]mm · 7 of 96 slices shown]
[im 1/96]
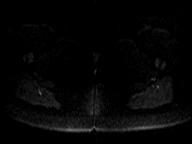
[im 16/96]
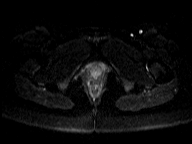
[im 32/96]
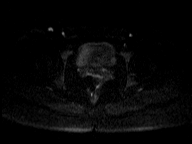
[im 48/96]
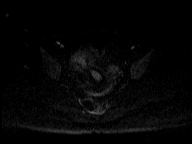
[im 64/96]
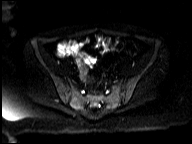
[im 80/96]
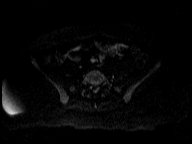
[im 96/96]
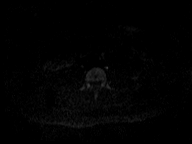

[Series 8: axial dwi_adc · axial · 6.0mm · 1.82mm/px · z∈[-103,+121]mm · 2 of 32 slices shown]
[im 1/32]
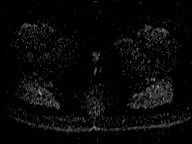
[im 32/32]
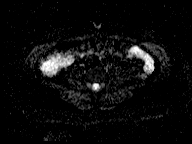

[Series 9: axial spgr no · axial · 7.0mm · 1.02mm/px · z∈[-92,+110]mm · 2 of 25 slices shown]
[im 1/25]
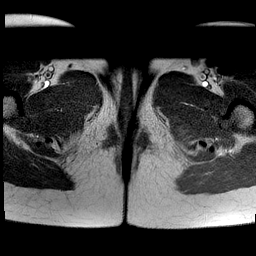
[im 25/25]
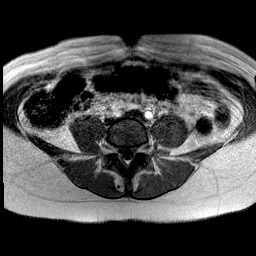

[Series 10: axial spgr fs-pre · axial · non-contrast · 7.0mm · 1.02mm/px · z∈[-92,+110]mm · 2 of 25 slices shown]
[im 1/25]
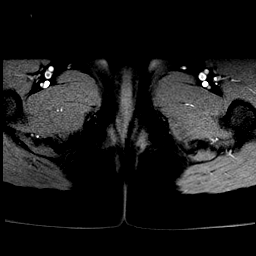
[im 25/25]
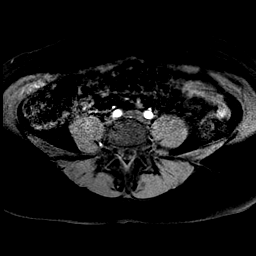

[Series 11: axial spgr fs-post · axial · 7.0mm · 1.02mm/px · z∈[-92,+110]mm · 2 of 25 slices shown (1 of 9)]
[im 1/25]
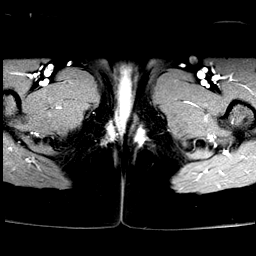
[im 25/25]
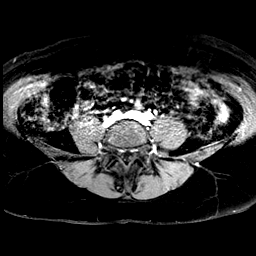

[Series 12: axial spgr fs-post · axial · 7.0mm · 1.02mm/px · z∈[-92,+110]mm · 2 of 25 slices shown (2 of 9)]
[im 1/25]
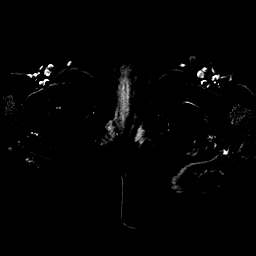
[im 25/25]
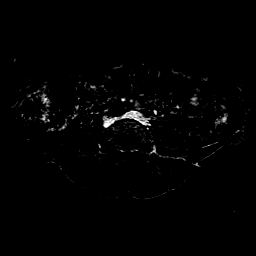

[Series 13: axial spgr fs-post · axial · 208.6mm · 1.02mm/px · 1 of 1 slices shown (3 of 9)]
[im 1/1]
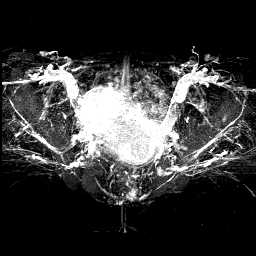

[Series 14: axial spgr fs-post · axial · 7.0mm · 1.02mm/px · z∈[-92,+110]mm · 2 of 25 slices shown (4 of 9)]
[im 1/25]
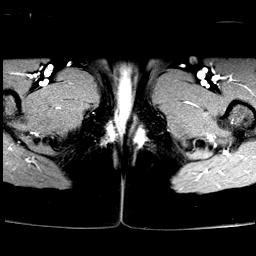
[im 25/25]
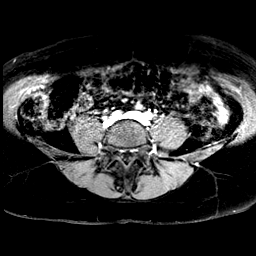

[Series 15: axial spgr fs-post · axial · 7.0mm · 1.02mm/px · z∈[-92,+110]mm · 2 of 25 slices shown (5 of 9)]
[im 1/25]
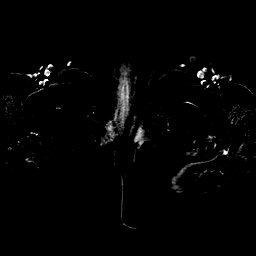
[im 25/25]
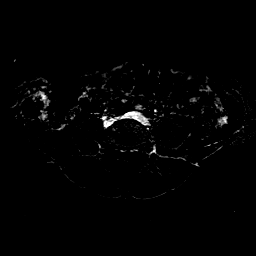

[Series 16: axial spgr fs-post · axial · 208.6mm · 1.02mm/px · 1 of 1 slices shown (6 of 9)]
[im 1/1]
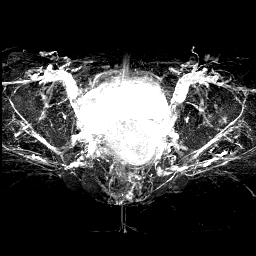

[Series 17: axial spgr fs-post · axial · 7.0mm · 1.02mm/px · z∈[-92,+110]mm · 2 of 25 slices shown (7 of 9)]
[im 1/25]
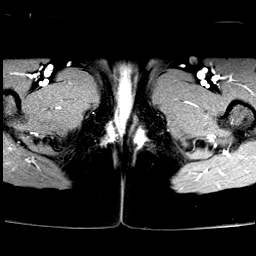
[im 25/25]
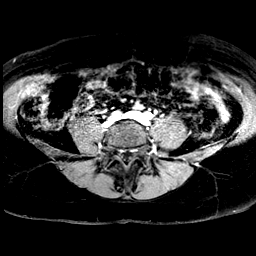

[Series 18: axial spgr fs-post · axial · 7.0mm · 1.02mm/px · z∈[-92,+110]mm · 2 of 25 slices shown (8 of 9)]
[im 1/25]
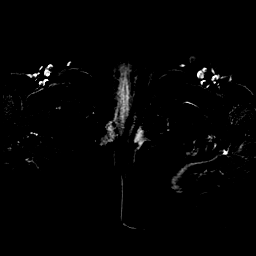
[im 25/25]
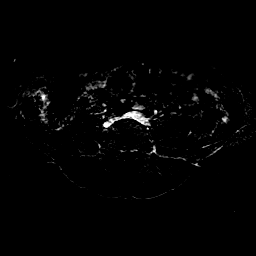

[Series 19: axial spgr fs-post · axial · 208.6mm · 1.02mm/px · 1 of 1 slices shown (9 of 9)]
[im 1/1]
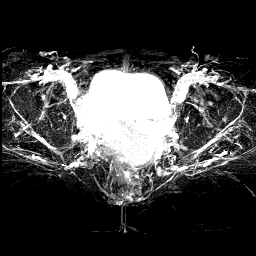

[Series 20: sag spgr fs · sagittal · 7.0mm · 1.02mm/px · 1 of 25 slices shown]
[im 1/25]
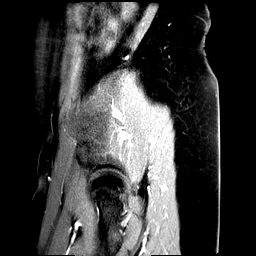

[45 of 48 positions shown; findings below may reference images not displayed]

FINDINGS: Urinary Tract: Normal bladder.  No urethral diverticulum.

Bowel: Unremarkable pelvic bowel loops.

Vascular/Lymphatic: No pathologically enlarged lymph nodes or other
significant abnormality.

Reproductive:

Uterus: Measures 3.3 x 7.8 x 4.5 cm. 60 cc volume. Junctional zone
well preserved. No evidence for substantial uterine fibroid disease
with a probable 5 mm intramural fibroid anterior uterine body
another possible 5 mm intramural fibroid towards the anterior
fundus. Endometrium unremarkable for patient age.

Right ovary: 2.5 x 3.1 x 2.5 cm. Multiple follicles. No mass. The
hemorrhagic cyst seen on previous MR and previous ultrasound of
[DATE] has resolved. On today's exam there is a tiny 12 mm focus
of intrinsic T1 shortening in the right ovary (see axial T2
precontrast image 12 of series 10) the demonstrates some
intermediate to low signal intensity on T2 imaging (image 12/series
5). Given motion artifact and misregistration, assessment for
enhancement within this tiny lesion is not possible.

Left ovary:  2.0 x 2.8 x 1.2 cm.  Normal appearance.

Other: No intraperitoneal free fluid.

Musculoskeletal:  Unremarkable.
IMPRESSION: 1. No suspicious abnormality in the right ovary. There is a tiny 12
mm focus of intrinsic T1 shortening that is nonspecific but could
represent sequelae of the involuted large right hemorrhagic ovarian
cyst seen on prior imaging. Given that the lesion is readily visible
on ultrasound, ultrasound surveillance could be used to ensure
stability as clinically warranted.
2. Normal MR appearance of the left ovary.

## 2021-10-24 MED ORDER — GADOBENATE DIMEGLUMINE 529 MG/ML IV SOLN
15.0000 mL | Freq: Once | INTRAVENOUS | Status: AC | PRN
  Administered 2021-10-24: 12:00:00 15 mL via INTRAVENOUS

## 2021-10-28 ENCOUNTER — Encounter: Payer: Self-pay | Admitting: Registered Nurse

## 2021-10-28 NOTE — Telephone Encounter (Signed)
Pt seeking explanation for her MR results appears ovarian cyst/fibroid that should be monitored via [place]   How should she be advised to proceed?

## 2022-01-30 ENCOUNTER — Encounter: Payer: Self-pay | Admitting: Registered Nurse

## 2022-02-01 ENCOUNTER — Other Ambulatory Visit: Payer: Self-pay | Admitting: Registered Nurse

## 2022-02-01 DIAGNOSIS — T753XXA Motion sickness, initial encounter: Secondary | ICD-10-CM

## 2022-02-01 MED ORDER — SCOPOLAMINE 1 MG/3DAYS TD PT72: 1.0000 | MEDICATED_PATCH | TRANSDERMAL | 12 refills | Status: AC

## 2022-03-23 ENCOUNTER — Other Ambulatory Visit: Payer: Self-pay

## 2022-03-23 ENCOUNTER — Encounter: Payer: Self-pay | Admitting: Registered Nurse

## 2022-03-23 ENCOUNTER — Ambulatory Visit: Payer: PRIVATE HEALTH INSURANCE | Admitting: Registered Nurse

## 2022-03-23 VITALS — BP 125/66 | HR 80 | Temp 98.2°F | Resp 18 | Ht 65.0 in | Wt 171.2 lb

## 2022-03-23 DIAGNOSIS — R140 Abdominal distension (gaseous): Secondary | ICD-10-CM | POA: Diagnosis not present

## 2022-03-23 DIAGNOSIS — Z8269 Family history of other diseases of the musculoskeletal system and connective tissue: Secondary | ICD-10-CM | POA: Diagnosis not present

## 2022-03-23 NOTE — Progress Notes (Signed)
Established Patient Office Visit  Subjective:  Patient ID: Jill Stafford, female    DOB: 1991/06/07  Age: 31 y.o. MRN: [id number]  CC:  Chief Complaint  Patient presents with   Bloated    Patient states she has been feeling bloated at different times a day for atleast 6 month. Patient is trying to figure out what's next.    HPI Jill Stafford presents for abdominal discomfort  Has been ongoing for a few months, but worsening in past 6 months. Diet typically includes protein with most meals, veggies. Limited junk food / processed. Bloating after every meal, and upon waking despite not eating late.  Occasional constipation. Occasional heartburn.  Notes fatigue, but busy at work - dozes after work, then bed by [id number]. Sometimes trouble staying asleep. Quiet snoring when sleeping but no witnessed apnea.  Does note more frequent headaches. Occ migraines with nv.   Notes she has an aunt with SLE who started with similar symptoms at close age.  Denies nvd, shob, doe.  Outpatient Medications Prior to Visit  Medication Sig Dispense Refill   ethynodiol-ethinyl estradiol (ZOVIA) 1-35 MG-MCG tablet TAKE 1 TABLET BY MOUTH DAILY 28 tablet 12   Multiple Vitamin (MULTIVITAMIN PO) Take by mouth.     scopolamine (TRANSDERM-SCOP) 1 MG/3DAYS Place 1 patch (1.5 mg total) onto the skin every 3 (three) days. 10 patch 12   traZODone (DESYREL) 50 MG tablet Take 0.5-1 tablets (25-50 mg total) by mouth at bedtime as needed for sleep. 30 tablet 3   No facility-administered medications prior to visit.    Review of Systems  Constitutional: Negative.   HENT: Negative.    Eyes: Negative.   Respiratory: Negative.    Cardiovascular: Negative.   Gastrointestinal:  Positive for abdominal distention, abdominal pain and constipation.  Genitourinary: Negative.   Musculoskeletal: Negative.   Skin: Negative.   Neurological: Negative.   Psychiatric/Behavioral: Negative.    All other systems reviewed and  are negative.    Objective:     BP 125/66   Pulse 80   Temp 98.2 F (36.8 C) (Temporal)   Resp 18   Ht '5\' 5"'$  (1.651 m)   Wt 171 lb 3.2 oz (77.7 kg)   SpO2 99%   BMI 28.49 kg/m   Wt Readings from Last 3 Encounters:  03/23/22 171 lb 3.2 oz (77.7 kg)  09/08/21 168 lb 3.2 oz (76.3 kg)  06/30/21 164 lb (74.4 kg)   Physical Exam Vitals and nursing note reviewed.  Constitutional:      General: She is not in acute distress.    Appearance: Normal appearance. She is normal weight. She is not ill-appearing, toxic-appearing or diaphoretic.  Cardiovascular:     Rate and Rhythm: Normal rate and regular rhythm.     Heart sounds: Normal heart sounds. No murmur heard.   No friction rub. No gallop.  Pulmonary:     Effort: Pulmonary effort is normal. No respiratory distress.     Breath sounds: Normal breath sounds. No stridor. No wheezing, rhonchi or rales.  Chest:     Chest wall: No tenderness.  Abdominal:     General: Abdomen is flat. Bowel sounds are normal. There is no distension.     Palpations: Abdomen is soft. There is no mass.     Tenderness: There is abdominal tenderness (lower, llq>rlq). There is no right CVA tenderness, left CVA tenderness, guarding or rebound.     Hernia: No hernia is present.  Skin:  General: Skin is warm and dry.  Neurological:     General: No focal deficit present.     Mental Status: She is alert and oriented to person, place, and time. Mental status is at baseline.  Psychiatric:        Mood and Affect: Mood normal.        Behavior: Behavior normal.        Thought Content: Thought content normal.        Judgment: Judgment normal.    No results found for any visits on 03/23/22.    The ASCVD Risk score (Arnett DK, et al., 2019) failed to calculate for the following reasons:   The 2019 ASCVD risk score is only valid for ages 44 to 32    Assessment & Plan:   Problem List Items Addressed This Visit   None Visit Diagnoses     Abdominal  bloating    -  Primary   Relevant Orders   CBC with Differential/Platelet   Comprehensive metabolic panel   Hemoglobin A1c   Lipid panel   TSH   T4, free   Family history of systemic lupus erythematosus       Relevant Orders   CBC with Differential/Platelet   Comprehensive metabolic panel   Hemoglobin A1c   Lipid panel   TSH   T4, free   Antinuclear Antib (ANA)   C-reactive protein   Sedimentation rate       No orders of the defined types were placed in this encounter.   Return if symptoms worsen or fail to improve.   PLAN Discussed healthy diet, exercise, hydration Check labs and determine further steps. Fits many criteria for ibs-c but will work on ruling out other etiologies.  Patient encouraged to call clinic with any questions, comments, or concerns.    Maximiano Coss, NP

## 2022-03-23 NOTE — Patient Instructions (Addendum)
Ms. Jill Stafford to see you  See below for FODMAP diet info - may be a contributor. Stay active, continue healthy diet, keep pushing the water.  I will let you know how labs look and we'll plan from there  Thanks,  Rich     If you have lab work done today you will be contacted with your lab results within the next 2 weeks.  If you have not heard from [place] then please contact us. The fastest way to get your results is to register for My Chart.   IF you received an x-ray today, you will receive an invoice from [facility]. Please contact [facility] at [phone] with questions or concerns regarding your invoice.   IF you received labwork today, you will receive an invoice from [facility]. Please contact [facility] at [phone] with questions or concerns regarding your invoice.   Our billing staff will not be able to assist you with questions regarding bills from these companies.  You will be contacted with the lab results as soon as they are available. The fastest way to get your results is to activate your My Chart account. Instructions are located on the last page of this paperwork. If you have not heard from [place] regarding the results in 2 weeks, please contact this office.

## 2022-03-23 NOTE — Addendum Note (Signed)
Addended by: Maximiano Coss on: 03/23/2022 10:14 AM   Modules accepted: Orders

## 2022-03-30 ENCOUNTER — Other Ambulatory Visit (INDEPENDENT_AMBULATORY_CARE_PROVIDER_SITE_OTHER): Payer: PRIVATE HEALTH INSURANCE

## 2022-03-30 DIAGNOSIS — Z8269 Family history of other diseases of the musculoskeletal system and connective tissue: Secondary | ICD-10-CM | POA: Diagnosis not present

## 2022-03-30 DIAGNOSIS — R140 Abdominal distension (gaseous): Secondary | ICD-10-CM | POA: Diagnosis not present

## 2022-03-30 LAB — LIPID PANEL
Cholesterol: 206 mg/dL — ABNORMAL HIGH (ref 0–200)
HDL: 51.5 mg/dL (ref >39.00)
LDL Cholesterol: 140 mg/dL — ABNORMAL HIGH (ref 0–99)
NonHDL: 154.91
Total CHOL/HDL Ratio: 4
Triglycerides: 74 mg/dL (ref 0.0–149.0)
VLDL: 14.8 mg/dL (ref 0.0–40.0)

## 2022-03-30 LAB — CBC WITH DIFFERENTIAL/PLATELET
Basophils Absolute: 0.1 10*3/uL (ref 0.0–0.1)
Basophils Relative: 1.1 % (ref 0.0–3.0)
Eosinophils Absolute: 0.1 10*3/uL (ref 0.0–0.7)
Eosinophils Relative: 1.1 % (ref 0.0–5.0)
HCT: 37.5 % (ref 36.0–46.0)
Hemoglobin: 12.5 g/dL (ref 12.0–15.0)
Lymphocytes Relative: 40.4 % (ref 12.0–46.0)
Lymphs Abs: 2.3 10*3/uL (ref 0.7–4.0)
MCHC: 33.3 g/dL (ref 30.0–36.0)
MCV: 88.8 fl (ref 78.0–100.0)
Monocytes Absolute: 0.5 10*3/uL (ref 0.1–1.0)
Monocytes Relative: 8.6 % (ref 3.0–12.0)
Neutro Abs: 2.7 10*3/uL (ref 1.4–7.7)
Neutrophils Relative %: 48.8 % (ref 43.0–77.0)
Platelets: 277 10*3/uL (ref 150.0–400.0)
RBC: 4.22 Mil/uL (ref 3.87–5.11)
RDW: 12.6 % (ref 11.5–15.5)
WBC: 5.6 10*3/uL (ref 4.0–10.5)

## 2022-03-30 LAB — COMPREHENSIVE METABOLIC PANEL
ALT: 18 U/L (ref 0–35)
AST: 15 U/L (ref 0–37)
Albumin: 4.1 g/dL (ref 3.5–5.2)
Alkaline Phosphatase: 39 U/L (ref 39–117)
BUN: 8 mg/dL (ref 6–23)
CO2: 28 mEq/L (ref 19–32)
Calcium: 9.3 mg/dL (ref 8.4–10.5)
Chloride: 102 mEq/L (ref 96–112)
Creatinine, Ser: 0.56 mg/dL (ref 0.40–1.20)
GFR: 121.65 mL/min (ref >60.00)
Glucose, Bld: 83 mg/dL (ref 70–99)
Potassium: 4.3 mEq/L (ref 3.5–5.1)
Sodium: 138 mEq/L (ref 135–145)
Total Bilirubin: 0.4 mg/dL (ref 0.2–1.2)
Total Protein: 7.2 g/dL (ref 6.0–8.3)

## 2022-03-30 LAB — C-REACTIVE PROTEIN: CRP: <1 mg/dL (ref 0.5–20.0)

## 2022-03-30 LAB — SEDIMENTATION RATE: Sed Rate: 20 mm/hr (ref 0–20)

## 2022-03-30 LAB — HEMOGLOBIN A1C: Hgb A1c MFr Bld: 5.4 % (ref 4.6–6.5)

## 2022-03-30 LAB — T4, FREE: Free T4: 0.9 ng/dL (ref 0.60–1.60)

## 2022-03-30 LAB — TSH: TSH: 1.37 u[IU]/mL (ref 0.35–5.50)

## 2022-04-01 LAB — ANTI-NUCLEAR AB-TITER (ANA TITER): ANA Titer 1: 1:40 {titer} — ABNORMAL HIGH

## 2022-04-01 LAB — ANA: Anti Nuclear Antibody (ANA): POSITIVE — AB

## 2022-04-02 ENCOUNTER — Encounter: Payer: Self-pay | Admitting: Registered Nurse

## 2022-04-05 ENCOUNTER — Other Ambulatory Visit: Payer: Self-pay | Admitting: Registered Nurse

## 2022-04-05 DIAGNOSIS — Z8269 Family history of other diseases of the musculoskeletal system and connective tissue: Secondary | ICD-10-CM

## 2022-04-05 DIAGNOSIS — R140 Abdominal distension (gaseous): Secondary | ICD-10-CM

## 2022-04-05 DIAGNOSIS — R768 Other specified abnormal immunological findings in serum: Secondary | ICD-10-CM

## 2022-04-06 ENCOUNTER — Other Ambulatory Visit: Payer: Self-pay | Admitting: Registered Nurse

## 2022-04-06 ENCOUNTER — Ambulatory Visit: Payer: No Typology Code available for payment source | Admitting: Registered Nurse

## 2022-04-06 DIAGNOSIS — R5382 Chronic fatigue, unspecified: Secondary | ICD-10-CM

## 2022-04-06 DIAGNOSIS — R140 Abdominal distension (gaseous): Secondary | ICD-10-CM

## 2022-04-13 ENCOUNTER — Other Ambulatory Visit (INDEPENDENT_AMBULATORY_CARE_PROVIDER_SITE_OTHER): Payer: PRIVATE HEALTH INSURANCE

## 2022-04-13 DIAGNOSIS — R5382 Chronic fatigue, unspecified: Secondary | ICD-10-CM | POA: Diagnosis not present

## 2022-04-13 DIAGNOSIS — R140 Abdominal distension (gaseous): Secondary | ICD-10-CM | POA: Diagnosis not present

## 2022-04-13 LAB — VITAMIN D 25 HYDROXY (VIT D DEFICIENCY, FRACTURES): VITD: 57.66 ng/mL (ref 30.00–100.00)

## 2022-04-13 LAB — B12 AND FOLATE PANEL
Folate: >24.2 ng/mL (ref >5.9)
Vitamin B-12: 324 pg/mL (ref 211–911)

## 2022-04-14 LAB — IGA: Immunoglobulin A: 214 mg/dL (ref 47–310)

## 2022-05-01 ENCOUNTER — Encounter: Payer: Self-pay | Admitting: Registered Nurse

## 2022-05-04 ENCOUNTER — Encounter: Payer: Self-pay | Admitting: Obstetrics and Gynecology

## 2022-05-04 ENCOUNTER — Telehealth: Payer: Self-pay | Admitting: Registered Nurse

## 2022-06-01 ENCOUNTER — Encounter: Payer: Self-pay | Admitting: Physician Assistant

## 2022-06-01 ENCOUNTER — Ambulatory Visit: Payer: PRIVATE HEALTH INSURANCE | Admitting: Physician Assistant

## 2022-06-01 VITALS — BP 96/60 | HR 66 | Temp 97.9°F | Ht 65.0 in | Wt 171.0 lb

## 2022-06-01 DIAGNOSIS — G479 Sleep disorder, unspecified: Secondary | ICD-10-CM | POA: Diagnosis not present

## 2022-06-01 DIAGNOSIS — R140 Abdominal distension (gaseous): Secondary | ICD-10-CM

## 2022-06-01 DIAGNOSIS — R5382 Chronic fatigue, unspecified: Secondary | ICD-10-CM | POA: Diagnosis not present

## 2022-06-01 MED ORDER — TRAZODONE HCL 50 MG PO TABS: 25.0000 mg | ORAL_TABLET | Freq: Every evening | ORAL | 3 refills | Status: AC | PRN

## 2022-06-01 NOTE — Progress Notes (Signed)
31 y.o. G0P0000 Single White or Caucasian Not Hispanic or Latino female here for annual exam.  On OCP's cycles are monthly x 4-5 days, she can saturate a super tampon in 4 hours. No BTB. Occasional cramps. Sexually active, same partner x 2 years. Live together.   She is having some discharge she is not sure if its normal or not. The d/c comes and goes, no odor, no itching, no burning.  She would also like STD testing.   H/O IBS, managed by primary, may f/u with GI. Has more issues with constipation, taking miralax prn.     Patient's last menstrual period was 05/24/2022 (approximate).          Sexually active: Yes.    The current method of family planning is OCP (estrogen/progesterone).    Exercising: Yes.    Gym/ health club routine includes cardio. Smoker:  no  Health Maintenance: Pap:  10/08/20 WNL History of abnormal Pap:  no MMG:  n/a BMD:   n/a Colonoscopy: n/a TDaP:  07/07/20 Gardasil: no, she would like to discuss.    reports that she has never smoked. She has never used smokeless tobacco. She reports current alcohol use. She reports that she does not use drugs. 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     scopolamine (TRANSDERM-SCOP) 1 MG/3DAYS Place 1 patch (1.5 mg total) onto the skin every 3 (three) days. 10 patch 12   traZODone (DESYREL) 50 MG tablet Take 0.5-1 tablets (25-50 mg total) by mouth at bedtime as needed for sleep. 30 tablet 3   No current facility-administered medications for this visit.    Family History  Problem Relation Age of  Onset   Rheum arthritis Father    Cancer Maternal Grandfather        Lung   Cancer Paternal Grandfather        Prostate   Rheum arthritis Maternal Aunt    Lupus Maternal Aunt     Review of Systems  Genitourinary:  Positive for vaginal discharge.  All other systems reviewed and are negative.   Exam:   BP 110/74   Pulse 65   Ht 5' 4.57" (1.64 m)   Wt 171 lb (77.6 kg)   LMP 05/24/2022 (Approximate)   SpO2 100%   BMI 28.84 kg/m   Weight change: '@WEIGHTCHANGE'$ @ Height:   Height: 5' 4.57" (164 cm)  Ht Readings from Last 3 Encounters:  06/08/22 5' 4.57" (1.64 m)  06/01/22 '5\' 5"'$  (1.651 m)  03/23/22 '5\' 5"'$  (1.651 m)    General appearance: alert, cooperative and appears stated age Head: Normocephalic, without obvious abnormality, atraumatic Neck: no adenopathy, supple, symmetrical, trachea midline and thyroid normal to inspection and palpation Lungs: clear to auscultation bilaterally Cardiovascular: regular rate and rhythm Breasts: normal appearance, no masses or tenderness Abdomen: soft, non-tender; non distended,  no masses,  no organomegaly Extremities: extremities normal, atraumatic, no cyanosis or edema Skin: Skin color, texture, turgor normal. No rashes or lesions Lymph nodes: Cervical,  supraclavicular, and axillary nodes normal. No abnormal inguinal nodes palpated Neurologic: Grossly normal   Pelvic: External genitalia:  no lesions              Urethra:  normal appearing urethra with no masses, tenderness or lesions              Bartholins and Skenes: normal                 Vagina: normal appearing vagina with a slight increase in watery, white vaginal d/c              Cervix: no lesions               Bimanual Exam:  Uterus:  normal size, contour, position, consistency, mobility, non-tender              Adnexa: no mass, fullness, tenderness               Rectovaginal: Confirms               Anus:  normal sphincter tone, no lesions  Gae Dry, CMA chaperoned for the  exam.  1. Well woman exam Discussed breast self exam Discussed calcium and vit D intake Pap due next year  2. Screening examination for STD (sexually transmitted disease) - RPR - HIV Antibody (routine testing w rflx) - Hepatitis C antibody - HSV(herpes simplex vrs) 1+2 ab-IgG - SURESWAB CT/NG/T. vaginalis  3. Encounter for surveillance of contraceptive pills Doing well - ethynodiol-ethinyl estradiol (ZOVIA) 1-35 MG-MCG tablet; Take 1 tablet by mouth daily.  Dispense: 84 tablet; Refill: 3  4. Immunization counseling Will start gardasil series - HPV 9-valent vaccine,Recombinat  5. Vaginal discharge - WET PREP FOR TRICH, YEAST, CLUE  6. Yeast vaginitis Allergy to diflucan, pharmacist stated it could be in dye in the medication. Will try terazol. Patient aware of possible reaction - terconazole (TERAZOL 7) 0.4 % vaginal cream; Place 1 applicator vaginally at bedtime. One applicator full QHS for seven days of therapy  Dispense: 45 g; Refill: 0

## 2022-06-01 NOTE — Patient Instructions (Signed)
It was great to see you!  Work on trialing trazodone 1/2 tablet over the weekend. Take 30 minutes prior to bedtime and consistently. Read sleep hygiene tips below. Let me know how this works for you.   To treat your constipation today; I would continue this to be a regular regimen for you. -Take 100 mg of docusate sodium (also known as Colace, however generic is fine!) by mouth twice daily to soften stools -Drink at least 64 oz of water daily -Add in 1 capful of polyethylene glycol (also known as Miralax, however generic is fine!) to beverage of choice daily  Goal is to have a formed, soft bowel movement regularly   -After a few days if no success, may increase to a total of 2 capfuls of polyethylene glycol/ Miralax daily  If still no results, please call the office     Sleep Hygiene  Do: (1) Go to bed at the same time each day. (2) Get up from bed at the same time each day. (3) Get regular exercise each day, preferably in the morning.  There is goof evidence that regular exercise improves restful sleep.  This includes stretching and aerobic exercise. (4) Get regular exposure to outdoor or bright lights, especially in the late afternoon. (5) Keep the temperature in your bedroom comfortable. (6) Keep the bedroom quiet when sleeping. (7) Keep the bedroom dark enough to facilitate sleep. (8) Use your bed only for sleep and sex. (9) Take medications as directed.  It is helpful to take prescribed sleeping pills 1 hour before bedtime, so they are causing drowsiness when you lie down, or 10 hours before getting up, to avoid daytime drowsiness. (10) Use a relaxation exercise just before going to sleep -- imagery, massage, warm bath. (11) Keep your feet and hands warm.  Wear warm socks and/or mittens or gloves to bed.  Don't: (1) Exercise just before going to bed. (2) Engage in stimulating activity just before bed, such as playing a competitive game, watching an exciting program on  television, or having an important discussion with a loved one. (3) Have caffeine in the evening (coffee, teas, chocolate, sodas, etc.) (4) Read or watch television in bed. (5) Use alcohol to help you sleep. (6) Go to bed too hungry or too full. (7) Take another person's sleeping pills. (8) Take over-the-counter sleeping pills, without your doctor's knowledge.  Tolerance can develop rapidly with these medications.  Diphenhydramine can have serious side effects for elderly patients. (9) Take daytime naps. (10) Command yourself to go to sleep.  This only makes your mind and body more alert.  If you lie awake for more than 20-30 minutes, get up, go to a different room, participate in a quiet activity (Ex - non-excitable reading or television), and then return to bed when you feel sleepy.  Do this as many times during the night as needed.  This may cause you to have a night or two of poor sleep but it will train your brain to know when it is time for sleep.    Take care,  Inda Coke PA-C

## 2022-06-01 NOTE — Progress Notes (Signed)
Jill Stafford is a 31 y.o. female here for a follow up on abdominal discomfort.   History of Present Illness:   Chief Complaint  Patient presents with   Transfer of care    Pt is transferring from [facility]   Abdominal Pain    Pt is c/o lower abdominal pain and bloating.    HPI  Abdominal Bloating  Patient here to establish care. States her previous PCP has left the practice. She complain of abdominal discomfort that has been onset for the past several months. Has been feeling bloating after waking up. States she thought her symptoms could be related to lactose intolerance. She started to follow healthy diet since then. States she has tried cutting down on gluten foods and has felt better. Her symptoms were improving at that time. However, she stopped following this diet for a while and her symptoms have recur.   States her symptoms are worse with fast foods. Trying to cut down on fast food and juices for the past 2 weeks. She is currently working out which include cardio and weight training. Has had issue with blood in stool in the past. She states this was resolved after eating well. Denies blood in stool for the past 2 weeks. Reports she used to have BM once a day in the past. Has had issue with constipation in the past but this has resolved. She has seen NP [facility] on 03/23/2022 for abdominal discomfort. Had lab work done at that which was all normal.   Does admit she had issue with hemorrhoids in the past. She was seen in the urgent care and was told this is fissure. Denies rectal bleeding. Denies nausea or vomiting. Denies blood in stools, prior significant GI history, fatty or greasy stools, vomiting, chest pain, or exertional chest pain  Chronic Fatigue  Patient states she has been dealing with this for a while. Tries to eat healthy and exercise but this does not help. She does have known hx of vitamin d deficiency, anemia, and fam hx of autoimmune processes. Patient  recently had lab work done which was all normal. However, she was positive for ANA antibodies. She has scheduled appointment with Rheumatologist in January next year.   Sleep Disturbance  Patient has been struggling to sleep at night for the past year or more. She states she sleeps only 5-6 hours. She took Trazodone once in the past but not recently. She reports that she took this so late that she is not sure if it was effective. She is currently working on improving diet and exercise. At this time, she is willing to trerial medication for her symptoms. No snoring or irregular breathing.   Past Medical History:  Diagnosis Date   Allergy    Anemia    Anxiety    Depression    Vitamin D deficiency      Social History   Tobacco Use   Smoking status: Never   Smokeless tobacco: Never  Vaping Use   Vaping Use: Never used  Substance Use Topics   Alcohol use: Yes    Comment: Socially   Drug use: No    Past Surgical History:  Procedure Laterality Date   ADENOIDECTOMY     NO PAST SURGERIES      Family History  Problem Relation Age of Onset   Rheum arthritis Father    Cancer Maternal Grandfather        Lung   Cancer Paternal Grandfather  Prostate   Rheum arthritis Maternal Aunt    Lupus Maternal Aunt     Allergies  Allergen Reactions   Fluconazole Rash    Current Medications:   Current Outpatient Medications:    ethynodiol-ethinyl estradiol (ZOVIA) 1-35 MG-MCG tablet, TAKE 1 TABLET BY MOUTH DAILY, Disp: 28 tablet, Rfl: 12   Multiple Vitamin (MULTIVITAMIN PO), Take by mouth., Disp: , Rfl:    scopolamine (TRANSDERM-SCOP) 1 MG/3DAYS, Place 1 patch (1.5 mg total) onto the skin every 3 (three) days., Disp: 10 patch, Rfl: 12   traZODone (DESYREL) 50 MG tablet, Take 0.5-1 tablets (25-50 mg total) by mouth at bedtime as needed for sleep., Disp: 30 tablet, Rfl: 3   Review of Systems:   ROS Negative unless otherwise specified per HPI.   Vitals:   Vitals:   06/01/22  0847  BP: 96/60  Pulse: 66  Temp: 97.9 F (36.6 C)  TempSrc: Temporal  SpO2: 97%  Weight: 171 lb (77.6 kg)  Height: '5\' 5"'$  (1.651 m)     Body mass index is 28.46 kg/m.  Physical Exam:   Physical Exam Vitals and nursing note reviewed.  Constitutional:      General: She is not in acute distress.    Appearance: She is well-developed. She is not ill-appearing or toxic-appearing.  Cardiovascular:     Rate and Rhythm: Normal rate and regular rhythm.     Pulses: Normal pulses.     Heart sounds: Normal heart sounds, S1 normal and S2 normal.  Pulmonary:     Effort: Pulmonary effort is normal.     Breath sounds: Normal breath sounds.  Skin:    General: Skin is warm and dry.  Neurological:     Mental Status: She is alert.     GCS: GCS eye subscore is 4. GCS verbal subscore is 5. GCS motor subscore is 6.  Psychiatric:        Speech: Speech normal.        Behavior: Behavior normal. Behavior is cooperative.     Assessment and Plan:   Abdominal bloating Recommended working on bowel regimen Discussed colace and miralax daily Follow-up if worsening/lack of improvement No red flags  Chronic fatigue Ongoing Discussed need for regular sleep Sleepy hygiene discussed and reviewed Follow-up with rheum Labs reviewed and were thorough -- no additional labs warranted at this time Recommend continued efforts at healthy lifestyle  Sleep disturbance Ongoing Discussed need for regular sleep Sleepy hygiene discussed and reviewed Restart trazodone, discussed this Follow-up if worsening/lack of improvement  I,Savera Zaman,acting as a scribe for [employer], PA.,have documented all relevant documentation on the behalf of Inda Coke, PA,as directed by  Inda Coke, PA while in the presence of Inda Coke, [place].   I, Inda Coke, [place], have reviewed all documentation for this visit. The documentation on 06/01/22 for the exam, diagnosis, procedures, and orders are all  accurate and complete.   Inda Coke, PA-C

## 2022-06-08 ENCOUNTER — Encounter: Payer: Self-pay | Admitting: Obstetrics and Gynecology

## 2022-06-08 ENCOUNTER — Ambulatory Visit (INDEPENDENT_AMBULATORY_CARE_PROVIDER_SITE_OTHER): Payer: PRIVATE HEALTH INSURANCE | Admitting: Obstetrics and Gynecology

## 2022-06-08 VITALS — BP 110/74 | HR 65 | Ht 64.57 in | Wt 171.0 lb

## 2022-06-08 DIAGNOSIS — N898 Other specified noninflammatory disorders of vagina: Secondary | ICD-10-CM

## 2022-06-08 DIAGNOSIS — B3731 Acute candidiasis of vulva and vagina: Secondary | ICD-10-CM

## 2022-06-08 DIAGNOSIS — Z01419 Encounter for gynecological examination (general) (routine) without abnormal findings: Secondary | ICD-10-CM | POA: Diagnosis not present

## 2022-06-08 DIAGNOSIS — Z113 Encounter for screening for infections with a predominantly sexual mode of transmission: Secondary | ICD-10-CM | POA: Diagnosis not present

## 2022-06-08 DIAGNOSIS — Z7185 Encounter for immunization safety counseling: Secondary | ICD-10-CM | POA: Diagnosis not present

## 2022-06-08 DIAGNOSIS — Z23 Encounter for immunization: Secondary | ICD-10-CM

## 2022-06-08 DIAGNOSIS — Z3041 Encounter for surveillance of contraceptive pills: Secondary | ICD-10-CM | POA: Diagnosis not present

## 2022-06-08 LAB — WET PREP FOR TRICH, YEAST, CLUE

## 2022-06-08 MED ORDER — ETHYNODIOL DIAC-ETH ESTRADIOL 1-35 MG-MCG PO TABS: 1.0000 | ORAL_TABLET | Freq: Every day | ORAL | 3 refills | Status: DC

## 2022-06-08 MED ORDER — TERCONAZOLE 0.4 % VA CREA: 1.0000 | TOPICAL_CREAM | Freq: Every day | VAGINAL | 0 refills | Status: DC

## 2022-06-08 NOTE — Patient Instructions (Signed)
Try magnesium 500 mg a day for constipation.  EXERCISE   We recommended that you start or continue a regular exercise program for good health. Physical activity is anything that gets your body moving, some is better than none. The CDC recommends 150 minutes per week of Moderate-Intensity Aerobic Activity and 2 or more days of Muscle Strengthening Activity.  Benefits of exercise are limitless: helps weight loss/weight maintenance, improves mood and energy, helps with depression and anxiety, improves sleep, tones and strengthens muscles, improves balance, improves bone density, protects from chronic conditions such as heart disease, high blood pressure and diabetes and so much more. To learn more visit: https://www.cdc.gov/physicalactivity/index.html  DIET: Good nutrition starts with a healthy diet of fruits, vegetables, whole grains, and lean protein sources. Drink plenty of water for hydration. Minimize empty calories, sodium, sweets. For more information about dietary recommendations visit: https://health.gov/our-work/nutrition-physical-activity/dietary-guidelines and https://www.myplate.gov/  ALCOHOL:  Women should limit their alcohol intake to no more than 7 drinks/beers/glasses of wine (combined, not each!) per week. Moderation of alcohol intake to this level decreases your risk of breast cancer and liver damage.  If you are concerned that you may have a problem, or your friends have told you they are concerned about your drinking, there are many resources to help. A well-known program that is free, effective, and available to all people all over the nation is Alcoholics Anonymous.  Check out this site to learn more: https://www.aa.org/   CALCIUM AND VITAMIN D:  Adequate intake of calcium and Vitamin D are recommended for bone health.  You should be getting between 1000-1200 mg of calcium and 800 units of Vitamin D daily between diet and supplements  PAP SMEARS:  Pap smears, to check for cervical  cancer or precancers,  have traditionally been done yearly, scientific advances have shown that most women can have pap smears less often.  However, every woman still should have a physical exam from her gynecologist every year. It will include a breast check, inspection of the vulva and vagina to check for abnormal growths or skin changes, a visual exam of the cervix, and then an exam to evaluate the size and shape of the uterus and ovaries. We will also provide age appropriate advice regarding health maintenance, like when you should have certain vaccines, screening for sexually transmitted diseases, bone density testing, colonoscopy, mammograms, etc.   MAMMOGRAMS:  All women over 40 years old should have a routine mammogram.   COLON CANCER SCREENING: Now recommend starting at age 45. At this time colonoscopy is not covered for routine screening until 50. There are take home tests that can be done between 45-49.   COLONOSCOPY:  Colonoscopy to screen for colon cancer is recommended for all women at age 50.  We know, you hate the idea of the prep.  We agree, BUT, having colon cancer and not knowing it is worse!!  Colon cancer so often starts as a polyp that can be seen and removed at colonscopy, which can quite literally save your life!  And if your first colonoscopy is normal and you have no family history of colon cancer, most women don't have to have it again for 10 years.  Once every ten years, you can do something that may end up saving your life, right?  We will be happy to help you get it scheduled when you are ready.  Be sure to check your insurance coverage so you understand how much it will cost.  It may be covered as a   preventative service at no cost, but you should check your particular policy.      Breast Self-Awareness Breast self-awareness means being familiar with how your breasts look and feel. It involves checking your breasts regularly and reporting any changes to your health care  provider. Practicing breast self-awareness is important. A change in your breasts can be a sign of a serious medical problem. Being familiar with how your breasts look and feel allows you to find any problems early, when treatment is more likely to be successful. All women should practice breast self-awareness, including women who have had breast implants. How to do a breast self-exam One way to learn what is normal for your breasts and whether your breasts are changing is to do a breast self-exam. To do a breast self-exam: Look for Changes  Remove all the clothing above your waist. Stand in front of a mirror in a room with good lighting. Put your hands on your hips. Push your hands firmly downward. Compare your breasts in the mirror. Look for differences between them (asymmetry), such as: Differences in shape. Differences in size. Puckers, dips, and bumps in one breast and not the other. Look at each breast for changes in your skin, such as: Redness. Scaly areas. Look for changes in your nipples, such as: Discharge. Bleeding. Dimpling. Redness. A change in position. Feel for Changes Carefully feel your breasts for lumps and changes. It is best to do this while lying on your back on the floor and again while sitting or standing in the shower or tub with soapy water on your skin. Feel each breast in the following way: Place the arm on the side of the breast you are examining above your head. Feel your breast with the other hand. Start in the nipple area and make  inch (2 cm) overlapping circles to feel your breast. Use the pads of your three middle fingers to do this. Apply light pressure, then medium pressure, then firm pressure. The light pressure will allow you to feel the tissue closest to the skin. The medium pressure will allow you to feel the tissue that is a little deeper. The firm pressure will allow you to feel the tissue close to the ribs. Continue the overlapping circles,  moving downward over the breast until you feel your ribs below your breast. Move one finger-width toward the center of the body. Continue to use the  inch (2 cm) overlapping circles to feel your breast as you move slowly up toward your collarbone. Continue the up and down exam using all three pressures until you reach your armpit.  Write Down What You Find  Write down what is normal for each breast and any changes that you find. Keep a written record with breast changes or normal findings for each breast. By writing this information down, you do not need to depend only on memory for size, tenderness, or location. Write down where you are in your menstrual cycle, if you are still menstruating. If you are having trouble noticing differences in your breasts, do not get discouraged. With time you will become more familiar with the variations in your breasts and more comfortable with the exam. How often should I examine my breasts? Examine your breasts every month. If you are breastfeeding, the best time to examine your breasts is after a feeding or after using a breast pump. If you menstruate, the best time to examine your breasts is 5-7 days after your period is over. During your period,   your breasts are lumpier, and it may be more difficult to notice changes. When should I see my health care provider? See your health care provider if you notice: A change in shape or size of your breasts or nipples. A change in the skin of your breast or nipples, such as a reddened or scaly area. Unusual discharge from your nipples. A lump or thick area that was not there before. Pain in your breasts. Anything that concerns you.  

## 2022-06-09 LAB — HIV ANTIBODY (ROUTINE TESTING W REFLEX): HIV 1&2 Ab, 4th Generation: NONREACTIVE

## 2022-06-09 LAB — SURESWAB CT/NG/T. VAGINALIS
C. trachomatis RNA, TMA: NOT DETECTED
N. gonorrhoeae RNA, TMA: NOT DETECTED
Trichomonas vaginalis RNA: NOT DETECTED

## 2022-06-09 LAB — HSV(HERPES SIMPLEX VRS) I + II AB-IGG
HAV 1 IGG,TYPE SPECIFIC AB: <0.9 index
HSV 2 IGG,TYPE SPECIFIC AB: <0.9 index

## 2022-06-09 LAB — HEPATITIS C ANTIBODY: Hepatitis C Ab: NONREACTIVE

## 2022-06-09 LAB — RPR: RPR Ser Ql: NONREACTIVE

## 2022-06-19 ENCOUNTER — Encounter: Payer: Self-pay | Admitting: Physician Assistant

## 2022-06-20 ENCOUNTER — Ambulatory Visit
Admission: EM | Admit: 2022-06-20 | Discharge: 2022-06-20 | Disposition: A | Discharge status: Home / Self Care | Payer: PRIVATE HEALTH INSURANCE

## 2022-06-20 NOTE — ED Triage Notes (Signed)
Pt states this morning she woke up with swelling and bruising to her left ring finger.  Home interventions: none

## 2022-06-20 NOTE — ED Provider Notes (Signed)
Patient complains of waking up this morning with swelling and bruising her left ring finger, does not recall injuring her finger.  Patient states she is never have this happen for her.  Patient states she had normal lab work beginning of this year.  Patient reports feeling well otherwise.  Patient demonstrates full range of motion of the finger, denies pain with movement.  Patient advised that there is no physical evaluation that I can perform today that will help her determine the source of her bruise.  Patient advised that if she is concerned about frequent bruising or easy bleeding despite normal lab analysis in the past year, she should follow-up with her primary care provider.  No billable services were performed during this visit today.   Lynden Oxford Scales, PA-C 06/20/22 1950

## 2022-07-06 ENCOUNTER — Encounter: Payer: Self-pay | Admitting: Physician Assistant

## 2022-07-06 ENCOUNTER — Ambulatory Visit: Payer: PRIVATE HEALTH INSURANCE | Admitting: Physician Assistant

## 2022-07-06 VITALS — BP 102/60 | HR 64 | Temp 98.3°F | Ht 65.0 in | Wt 169.2 lb

## 2022-07-06 DIAGNOSIS — F320 Major depressive disorder, single episode, mild: Secondary | ICD-10-CM

## 2022-07-06 DIAGNOSIS — N838 Other noninflammatory disorders of ovary, fallopian tube and broad ligament: Secondary | ICD-10-CM | POA: Diagnosis not present

## 2022-07-06 DIAGNOSIS — R5382 Chronic fatigue, unspecified: Secondary | ICD-10-CM | POA: Diagnosis not present

## 2022-07-06 DIAGNOSIS — R42 Dizziness and giddiness: Secondary | ICD-10-CM | POA: Diagnosis not present

## 2022-07-06 LAB — COMPREHENSIVE METABOLIC PANEL
ALT: 13 U/L (ref 0–35)
AST: 14 U/L (ref 0–37)
Albumin: 3.9 g/dL (ref 3.5–5.2)
Alkaline Phosphatase: 38 U/L — ABNORMAL LOW (ref 39–117)
BUN: 12 mg/dL (ref 6–23)
CO2: 25 mEq/L (ref 19–32)
Calcium: 9.2 mg/dL (ref 8.4–10.5)
Chloride: 103 mEq/L (ref 96–112)
Creatinine, Ser: 0.61 mg/dL (ref 0.40–1.20)
GFR: 118.94 mL/min (ref >60.00)
Glucose, Bld: 81 mg/dL (ref 70–99)
Potassium: 4.8 mEq/L (ref 3.5–5.1)
Sodium: 137 mEq/L (ref 135–145)
Total Bilirubin: 0.3 mg/dL (ref 0.2–1.2)
Total Protein: 7.4 g/dL (ref 6.0–8.3)

## 2022-07-06 LAB — CBC WITH DIFFERENTIAL/PLATELET
Basophils Absolute: 0 10*3/uL (ref 0.0–0.1)
Basophils Relative: 0.6 % (ref 0.0–3.0)
Eosinophils Absolute: 0.1 10*3/uL (ref 0.0–0.7)
Eosinophils Relative: 0.9 % (ref 0.0–5.0)
HCT: 37 % (ref 36.0–46.0)
Hemoglobin: 12.4 g/dL (ref 12.0–15.0)
Lymphocytes Relative: 36.8 % (ref 12.0–46.0)
Lymphs Abs: 2.5 10*3/uL (ref 0.7–4.0)
MCHC: 33.4 g/dL (ref 30.0–36.0)
MCV: 90.3 fl (ref 78.0–100.0)
Monocytes Absolute: 0.4 10*3/uL (ref 0.1–1.0)
Monocytes Relative: 6.7 % (ref 3.0–12.0)
Neutro Abs: 3.7 10*3/uL (ref 1.4–7.7)
Neutrophils Relative %: 55 % (ref 43.0–77.0)
Platelets: 248 10*3/uL (ref 150.0–400.0)
RBC: 4.1 Mil/uL (ref 3.87–5.11)
RDW: 12.8 % (ref 11.5–15.5)
WBC: 6.7 10*3/uL (ref 4.0–10.5)

## 2022-07-06 LAB — VITAMIN B12: Vitamin B-12: 268 pg/mL (ref 211–911)

## 2022-07-06 LAB — IBC + FERRITIN
Ferritin: 13.2 ng/mL (ref 10.0–291.0)
Iron: 88 ug/dL (ref 42–145)
Saturation Ratios: 19.6 % — ABNORMAL LOW (ref 20.0–50.0)
TIBC: 448 ug/dL (ref 250.0–450.0)
Transferrin: 320 mg/dL (ref 212.0–360.0)

## 2022-07-06 LAB — VITAMIN D 25 HYDROXY (VIT D DEFICIENCY, FRACTURES): VITD: 39.28 ng/mL (ref 30.00–100.00)

## 2022-07-06 NOTE — Progress Notes (Signed)
Jill Stafford is a 31 y.o. female here for a follow up of a pre-existing problem.  History of Present Illness:   Chief Complaint  Patient presents with   Headache    Frequent headaches started a couple of months ago   Dizziness    Started a couple of months ago    HPI  Intermittent lightheadedness Not constant or if doing anything strenuous. Feels lightheaded. No syncope, chest pain, SOB, palpitations. Has had low iron in the past and would like this checked. Has had some easy bruising -- was on lower legs recently without known trauma -- none on trunk. Has always had bleeding from her gums, none more than normal recently. Eating regularly throughout the day, no skipped  meals or concerns for low blood sugar Tries to drink plenty of water Drinks 1 cup of caffeine daily No major sleeping concerns Does eat iron rich foods Takes birth control regularly, Periods are somewhat heavy -- lasts 5-6 days. Can be painful at times. Hx of ovarian cyst.   Ovarian cyst Reviewed most recent MRI of pelvis. She has not had anymore imaging of this. IMPRESSION: 1. No suspicious abnormality in the right ovary. There is a tiny 12 mm focus of intrinsic T1 shortening that is nonspecific but could represent sequelae of the involuted large right hemorrhagic ovarian cyst seen on prior imaging. Given that the lesion is readily visible on ultrasound, ultrasound surveillance could be used to ensure stability as clinically warranted. 2. Normal MR appearance of the left ovary.   Depression Recent death of ex-boyfriend. Current boyfriend is moving to [employer] and she may be moving there soon. She feels lack of motivation. Denies SI/HI. Sees therapist regularly. Not interested in medication.    Past Medical History:  Diagnosis Date   Allergy    Anemia    Anxiety    Depression    Vitamin D deficiency      Social History   Tobacco Use   Smoking status: Never   Smokeless tobacco: Never   Vaping Use   Vaping Use: Never used  Substance Use Topics   Alcohol use: Yes    Comment: Socially   Drug use: No    Past Surgical History:  Procedure Laterality Date   ADENOIDECTOMY     NO PAST SURGERIES      Family History  Problem Relation Age of Onset   Rheum arthritis Father    Cancer Maternal Grandfather        Lung   Cancer Paternal Grandfather        Prostate   Rheum arthritis Maternal Aunt    Lupus Maternal Aunt     Allergies  Allergen Reactions   Fluconazole Rash    Current Medications:   Current Outpatient Medications:    ethynodiol-ethinyl estradiol (ZOVIA) 1-35 MG-MCG tablet, Take 1 tablet by mouth daily., Disp: 84 tablet, Rfl: 3   Multiple Vitamin (MULTIVITAMIN PO), Take by mouth., Disp: , Rfl:    scopolamine (TRANSDERM-SCOP) 1 MG/3DAYS, Place 1 patch (1.5 mg total) onto the skin every 3 (three) days., Disp: 10 patch, Rfl: 12   terconazole (TERAZOL 7) 0.4 % vaginal cream, Place 1 applicator vaginally at bedtime. One applicator full QHS for seven days of therapy, Disp: 45 g, Rfl: 0   traZODone (DESYREL) 50 MG tablet, Take 0.5-1 tablets (25-50 mg total) by mouth at bedtime as needed for sleep., Disp: 30 tablet, Rfl: 3   Review of Systems:   ROS  Vitals:   Vitals:  07/06/22 0902  BP: 102/60  Pulse: 64  Temp: 98.3 F (36.8 C)  TempSrc: Temporal  SpO2: 97%  Weight: 169 lb 4 oz (76.8 kg)  Height: '5\' 5"'$  (1.651 m)     Body mass index is 28.16 kg/m.  Physical Exam:   Physical Exam Vitals and nursing note reviewed.  Constitutional:      General: She is not in acute distress.    Appearance: Normal appearance. She is well-developed. She is not ill-appearing or toxic-appearing.  HENT:     Head: Normocephalic and atraumatic.  Eyes:     General: Lids are normal.     Extraocular Movements: Extraocular movements intact.     Conjunctiva/sclera: Conjunctivae normal.  Cardiovascular:     Rate and Rhythm: Normal rate and regular rhythm.     Pulses:  Normal pulses.     Heart sounds: Normal heart sounds, S1 normal and S2 normal.  Pulmonary:     Effort: Pulmonary effort is normal.     Breath sounds: Normal breath sounds.  Musculoskeletal:        General: Normal range of motion.     Cervical back: Normal range of motion and neck supple.  Skin:    General: Skin is warm and dry.  Neurological:     Mental Status: She is alert and oriented to person, place, and time.     GCS: GCS eye subscore is 4. GCS verbal subscore is 5. GCS motor subscore is 6.     Cranial Nerves: Cranial nerves 2-12 are intact.     Sensory: Sensation is intact.     Motor: Motor function is intact.     Coordination: Coordination is intact.  Psychiatric:        Attention and Perception: Attention and perception normal.        Mood and Affect: Mood normal.        Speech: Speech normal.        Behavior: Behavior normal. Behavior is cooperative.        Thought Content: Thought content normal.        Judgment: Judgment normal.     Assessment and Plan:   Intermittent lightheadedness Exam WNL No red flags Update blood work today Continue hydration and regular meals If new symptoms, will consider cardiac or neuro work-up if indicated  Chronic fatigue Update blood work and recommend that she works on [employer], academic as able Continue healthy lifestyle measures  Depression, major, single episode, mild (HCC) No red flags or SI/HI Continue talk therapy Declines medication Consider prozac or wellbutrin in future if interested I discussed with patient that if they develop any SI, to tell someone immediately and seek medical attention.   Ovarian mass, right Will reach out to her gynecologist about last MR to see if further imaging needs to be done    Inda Coke, PA-C

## 2022-07-06 NOTE — Patient Instructions (Signed)
It was great to see you!  Work on hydration Think about medications for depression -- prozac and wellbutrin may be good choices to start with   I will be in touch with your results and what Dr. Talbert Nan says about your ovary imaging results  If you develop suicidal thoughts, please tell someone and immediately proceed to our local 24/7 crisis center, [facility] the [facility]. [address] [place], [place] [address] [phone].  Take care,  Inda Coke PA-C

## 2022-07-07 ENCOUNTER — Encounter: Payer: Self-pay | Admitting: Physician Assistant

## 2022-07-10 ENCOUNTER — Encounter: Payer: Self-pay | Admitting: Obstetrics and Gynecology

## 2022-07-13 ENCOUNTER — Ambulatory Visit (INDEPENDENT_AMBULATORY_CARE_PROVIDER_SITE_OTHER): Payer: PRIVATE HEALTH INSURANCE

## 2022-07-13 DIAGNOSIS — E538 Deficiency of other specified B group vitamins: Secondary | ICD-10-CM | POA: Diagnosis not present

## 2022-07-13 MED ORDER — CYANOCOBALAMIN 1000 MCG/ML IJ SOLN
1000.0000 ug | Freq: Once | INTRAMUSCULAR | Status: AC
  Administered 2022-07-13: 1000 ug via INTRAMUSCULAR

## 2022-07-13 NOTE — Progress Notes (Signed)
Patient was given a b12 injection in the right arm per Inda Coke, PA. Patient tolerated injection well. Joanette Gula, CMA

## 2022-07-20 ENCOUNTER — Ambulatory Visit (INDEPENDENT_AMBULATORY_CARE_PROVIDER_SITE_OTHER): Payer: PRIVATE HEALTH INSURANCE

## 2022-07-20 ENCOUNTER — Encounter: Payer: Self-pay | Admitting: Physician Assistant

## 2022-07-20 DIAGNOSIS — Z23 Encounter for immunization: Secondary | ICD-10-CM

## 2022-07-20 DIAGNOSIS — E538 Deficiency of other specified B group vitamins: Secondary | ICD-10-CM

## 2022-07-20 MED ORDER — CYANOCOBALAMIN 1000 MCG/ML IJ SOLN
1000.0000 ug | Freq: Once | INTRAMUSCULAR | Status: AC
  Administered 2022-07-20: 1000 ug via INTRAMUSCULAR

## 2022-07-20 NOTE — Progress Notes (Signed)
2nd Gardasil given IM Right.  Patient tolerated injection well.

## 2022-07-20 NOTE — Progress Notes (Signed)
Pt being seen in the office for Vitamin B-12 deficiency. Into get weekly B-12 injection. Pt states this will be second week of injections. Administered in left deltoid and pt tolerated well.

## 2022-07-24 ENCOUNTER — Other Ambulatory Visit: Payer: Self-pay | Admitting: Physician Assistant

## 2022-07-24 DIAGNOSIS — S01311A Laceration without foreign body of right ear, initial encounter: Secondary | ICD-10-CM

## 2022-07-24 DIAGNOSIS — N838 Other noninflammatory disorders of ovary, fallopian tube and broad ligament: Secondary | ICD-10-CM

## 2022-07-24 DIAGNOSIS — Z3041 Encounter for surveillance of contraceptive pills: Secondary | ICD-10-CM

## 2022-07-27 ENCOUNTER — Ambulatory Visit (INDEPENDENT_AMBULATORY_CARE_PROVIDER_SITE_OTHER): Payer: PRIVATE HEALTH INSURANCE

## 2022-07-27 DIAGNOSIS — E538 Deficiency of other specified B group vitamins: Secondary | ICD-10-CM | POA: Diagnosis not present

## 2022-07-27 MED ORDER — CYANOCOBALAMIN 1000 MCG/ML IJ SOLN
1000.0000 ug | Freq: Once | INTRAMUSCULAR | Status: AC
  Administered 2022-07-27: 1000 ug via INTRAMUSCULAR

## 2022-07-27 NOTE — Progress Notes (Signed)
Patient was given a b12 injection in the right deltoid. Patient tolerated injection well. TL,CMA

## 2022-08-02 ENCOUNTER — Encounter: Payer: Self-pay | Admitting: Physician Assistant

## 2022-08-04 ENCOUNTER — Other Ambulatory Visit: Payer: Self-pay

## 2022-08-10 ENCOUNTER — Ambulatory Visit: Payer: PRIVATE HEALTH INSURANCE

## 2022-08-24 ENCOUNTER — Encounter: Payer: Self-pay | Admitting: Physician Assistant

## 2022-08-24 ENCOUNTER — Ambulatory Visit (INDEPENDENT_AMBULATORY_CARE_PROVIDER_SITE_OTHER): Payer: PRIVATE HEALTH INSURANCE

## 2022-08-24 DIAGNOSIS — R5383 Other fatigue: Secondary | ICD-10-CM | POA: Diagnosis not present

## 2022-08-24 DIAGNOSIS — E539 Vitamin B deficiency, unspecified: Secondary | ICD-10-CM | POA: Diagnosis not present

## 2022-08-24 MED ORDER — CYANOCOBALAMIN 1000 MCG/ML IJ SOLN
1000.0000 ug | Freq: Once | INTRAMUSCULAR | Status: AC
  Administered 2022-08-24: 1000 ug via INTRAMUSCULAR

## 2022-08-24 NOTE — Progress Notes (Signed)
Pt tolerated b12 well. 

## 2022-09-07 ENCOUNTER — Ambulatory Visit: Payer: PRIVATE HEALTH INSURANCE | Admitting: Radiology

## 2022-09-07 VITALS — BP 116/68

## 2022-09-07 DIAGNOSIS — Z8742 Personal history of other diseases of the female genital tract: Secondary | ICD-10-CM | POA: Diagnosis not present

## 2022-09-07 DIAGNOSIS — D219 Benign neoplasm of connective and other soft tissue, unspecified: Secondary | ICD-10-CM | POA: Diagnosis not present

## 2022-09-07 DIAGNOSIS — Z3041 Encounter for surveillance of contraceptive pills: Secondary | ICD-10-CM

## 2022-09-07 MED ORDER — JUNEL FE 24 1-20 MG-MCG(24) PO TABS: 1.0000 | ORAL_TABLET | Freq: Every day | ORAL | 2 refills | Status: DC

## 2022-09-07 NOTE — Progress Notes (Signed)
Jill Stafford 1991-08-31 [id number]   History:  31 y.o. Here to discuss ultrasound results from 08/24/22 done at [facility]. Hx of ovarian cyst, had not had follow up since diagnosed. Concerned ultrasound showed uterine fibroids and she would like future pregnancy. Concerned because she has been on ocps x's 10+ years and feels like she has too much estrogen. Unsure if she should stop ocps due to fibroids and increased estrogen.    Gynecologic History Patient's last menstrual period was 08/17/2022 (exact date).  Dysmenorrhea: yes Menorrhagia: yes Contraception/Family planning: OCP (estrogen/progesterone) Sexually active: yes   Obstetric History OB History  Gravida Para Term Preterm AB Living  0 0 0 0 0 0  SAB IAB Ectopic Multiple Live Births  0 0 0 0 0     The following portions of the patient's history were reviewed and updated as appropriate: allergies, current medications, past family history, past medical history, past social history, past surgical history, and problem list.  Review of Systems Pertinent items noted in HPI and remainder of comprehensive ROS otherwise negative.   Past medical history, past surgical history, family history and social history were all reviewed and documented in the EPIC chart.   Exam:  Vitals:   09/07/22 0827  BP: 116/68   There is no height or weight on file to calculate BMI.  Physical Exam Constitutional:      Appearance: Normal appearance. She is normal weight.  Neurological:     Mental Status: She is alert and oriented to person, place, and time. Mental status is at baseline.  Psychiatric:        Mood and Affect: Mood normal.        Thought Content: Thought content normal.        Judgment: Judgment normal.      Procedure Note  Diamantina Providence, MD - 08/24/2022 Formatting of this note might be different from the original. US PELVIS COMPLETE TRANSABD TRANSVAG WO DUPLEX, 08/24/2022 11:37 AM  INDICATION:  \ N83.8 Ovarian  mass, right COMPARISON: Multiple prior pelvic MRI and [place] at [facility] are not available for comparison. Reports are available for viewing in the electronic medical record.  TRANSVAGINAL EXAM:  TECHNIQUE: Multi-planar real-time grayscale ultrasound of the pelvis via a transvaginal approach was performed, supplemented by color and/or power Doppler for limited purposes of general vascularity evaluation.  FINDINGS:  UTERUS: .  Size = 9.0 x 5.1 x 4.0 cm. Morphology . Multiple small fibroids are noted. The largest fibroid at the posterior fundus measures 1.3 x 0.9 x 1.3 cm. .  Endometrium: Width = 3 mm. .  Cervix: Visualized portions unremarkable.  RIGHT Adnexa: .  Ovary: Size = 4.1 x 2.3 x 2.1 cm. Multiple small follicles. General vascularity appears preserved. .  Other: No additional extra-ovarian abnormalities of note.  LEFT Adnexa: .  Ovary: Size = 2.0 x 3.4 x 1.5 cm. No apparent suspicious lesions. General vascularity appears preserved. .  Other: No additional extra-ovarian abnormalities of note.  Peritoneum: No fluid in the pelvic cul-de-sac. Slide test: There is free movement of the visualized uterine and bowel surfaces. Other: None.  CONCLUSION:  1.  Multiple small follicles. 2.  Fibroid uterus.  Assessment/Plan:   1. Fibroid Reassured small, unlikely to be causing her pain or bloating  2. Encounter for surveillance of contraceptive pills Will change OCP to lower estrogen pill to decrease flow and side effects - Norethindrone Acetate-Ethinyl Estrad-FE (JUNEL FE 24) 1-20 MG-MCG(24) tablet; Take 1 tablet by mouth  daily.  Dispense: 84 tablet; Refill: 2  3. History of ovarian cyst Continuing OCPs should prevent recurrence    Jendayi Berling B WHNP-BC 8:50 AM 09/07/2022

## 2022-09-11 ENCOUNTER — Ambulatory Visit: Payer: PRIVATE HEALTH INSURANCE | Admitting: Obstetrics and Gynecology

## 2022-09-14 ENCOUNTER — Encounter: Payer: No Typology Code available for payment source | Admitting: Registered Nurse

## 2022-09-28 ENCOUNTER — Encounter: Payer: Self-pay | Admitting: Obstetrics and Gynecology

## 2022-09-28 ENCOUNTER — Ambulatory Visit (INDEPENDENT_AMBULATORY_CARE_PROVIDER_SITE_OTHER): Payer: PRIVATE HEALTH INSURANCE | Admitting: *Deleted

## 2022-09-28 DIAGNOSIS — E538 Deficiency of other specified B group vitamins: Secondary | ICD-10-CM | POA: Diagnosis not present

## 2022-09-28 MED ORDER — CYANOCOBALAMIN 1000 MCG/ML IJ SOLN
1000.0000 ug | Freq: Once | INTRAMUSCULAR | Status: AC
  Administered 2022-09-28: 1000 ug via INTRAMUSCULAR

## 2022-09-28 NOTE — Progress Notes (Addendum)
Per orders of Inda Coke, [place], injection of B 12 given in right deltoid per patient preference by Zacarias Pontes, CMA. Patient tolerated injection well. Patient reminded to schedule next injection.  I agree with the above information and plan.  Inda Coke PA-C

## 2022-10-05 ENCOUNTER — Ambulatory Visit: Payer: PRIVATE HEALTH INSURANCE | Admitting: Obstetrics and Gynecology

## 2022-10-19 NOTE — Progress Notes (Deleted)
Office Visit Note  Patient: Jill Stafford             Date of Birth: 30-Jan-1991           MRN: [id number]             PCP: Inda Coke, PA Referring: Maximiano Coss, NP Visit Date: 11/02/2022 Occupation: @GUAROCC @  Subjective:  No chief complaint on file.   History of Present Illness: Jill Stafford is a 31 y.o. female ***     Activities of Daily Living:  Patient reports morning stiffness for *** {minute/hour:19697}.   Patient {ACTIONS;DENIES/REPORTS:21021675::"Denies"} nocturnal pain.  Difficulty dressing/grooming: {ACTIONS;DENIES/REPORTS:21021675::"Denies"} Difficulty climbing stairs: {ACTIONS;DENIES/REPORTS:21021675::"Denies"} Difficulty getting out of chair: {ACTIONS;DENIES/REPORTS:21021675::"Denies"} Difficulty using hands for taps, buttons, cutlery, and/or writing: {ACTIONS;DENIES/REPORTS:21021675::"Denies"}  No Rheumatology ROS completed.   PMFS History:  There are no problems to display for this patient.   Past Medical History:  Diagnosis Date   Allergy    Anemia    Anxiety    Depression    Vitamin D deficiency     Family History  Problem Relation Age of Onset   Rheum arthritis Father    Cancer Maternal Grandfather        Lung   Cancer Paternal Grandfather        Prostate   Rheum arthritis Maternal Aunt    Lupus Maternal Aunt    Past Surgical History:  Procedure Laterality Date   ADENOIDECTOMY     NO PAST SURGERIES     Social History   Social History Narrative   FT CMA   Lives in [place]   Immunization History  Administered Date(s) Administered   HPV 9-valent 06/08/2022, 07/20/2022   Influenza,inj,Quad PF,6+ Mos 08/26/2016, 07/29/2020, 08/15/2021   PFIZER(Purple Top)SARS-COV-2 Vaccination 11/07/2019, 07/07/2020   PPD Test 08/26/2016, 09/14/2016, 04/23/2019   Tdap 08/26/2016, 07/07/2020     Objective: Vital Signs: There were no vitals taken for this visit.   Physical Exam   Musculoskeletal Exam: ***  CDAI Exam: CDAI  Score: -- Patient Global: --; Provider Global: -- Swollen: --; Tender: -- Joint Exam 11/02/2022   No joint exam has been documented for this visit   There is currently no information documented on the homunculus. Go to the Rheumatology activity and complete the homunculus joint exam.  Investigation: No additional findings.  Imaging: No results found.  Recent Labs: Lab Results  Component Value Date   WBC 6.7 07/06/2022   HGB 12.4 07/06/2022   PLT 248.0 07/06/2022   NA 137 07/06/2022   K 4.8 07/06/2022   CL 103 07/06/2022   CO2 25 07/06/2022   GLUCOSE 81 07/06/2022   BUN 12 07/06/2022   CREATININE 0.61 07/06/2022   BILITOT 0.3 07/06/2022   ALKPHOS 38 (L) 07/06/2022   AST 14 07/06/2022   ALT 13 07/06/2022   PROT 7.4 07/06/2022   ALBUMIN 3.9 07/06/2022   CALCIUM 9.2 07/06/2022   QFTBGOLD Negative 01/09/2017    Speciality Comments: No specialty comments available.  Procedures:  No procedures performed Allergies: Fluconazole   Assessment / Plan:     Visit Diagnoses: Positive ANA (antinuclear antibody) - 03/30/22: ANA 1:40NH, TSH 1.37, ESR 20, CRP WNL. 04/13/22: VitB12 324, folate >24.2, vitamin D 57.66  Family history of systemic lupus erythematosus  Abdominal bloating  Orders: No orders of the defined types were placed in this encounter.  No orders of the defined types were placed in this encounter.   Face-to-face time spent with patient was *** minutes. Greater than  50% of time was spent in counseling and coordination of care.  Follow-Up Instructions: No follow-ups on file.   [name], PA-C  Note - This record has been created using Dragon software.  Chart creation errors have been sought, but may not always  have been located. Such creation errors do not reflect on  the standard of medical care.

## 2022-11-01 ENCOUNTER — Encounter: Payer: Self-pay | Admitting: Rheumatology

## 2022-11-02 ENCOUNTER — Ambulatory Visit: Payer: PRIVATE HEALTH INSURANCE | Admitting: Rheumatology

## 2022-11-02 DIAGNOSIS — R140 Abdominal distension (gaseous): Secondary | ICD-10-CM

## 2022-11-02 DIAGNOSIS — R768 Other specified abnormal immunological findings in serum: Secondary | ICD-10-CM

## 2022-11-02 DIAGNOSIS — Z8269 Family history of other diseases of the musculoskeletal system and connective tissue: Secondary | ICD-10-CM

## 2022-11-14 ENCOUNTER — Encounter: Payer: Self-pay | Admitting: Obstetrics and Gynecology

## 2022-11-14 DIAGNOSIS — Z3041 Encounter for surveillance of contraceptive pills: Secondary | ICD-10-CM

## 2022-11-14 MED ORDER — JUNEL FE 24 1-20 MG-MCG(24) PO TABS: 1.0000 | ORAL_TABLET | Freq: Every day | ORAL | 2 refills | Status: AC

## 2022-11-14 NOTE — Telephone Encounter (Signed)
AEX 06/08/2022  Past due.

## 2022-11-14 NOTE — Telephone Encounter (Signed)
Please let her know that it is fine to only bleed for 1.5 days on the pill. Some women stop getting there cycle on the pill because the lining of the uterus gets so thin from the hormones. Please send in a 3 month refill of her OCP's with 2 refills.

## 2022-11-22 ENCOUNTER — Encounter: Payer: Self-pay | Admitting: Obstetrics and Gynecology

## 2022-11-23 ENCOUNTER — Ambulatory Visit: Payer: PRIVATE HEALTH INSURANCE | Admitting: Rheumatology

## 2022-11-23 ENCOUNTER — Ambulatory Visit: Payer: PRIVATE HEALTH INSURANCE
